# Patient Record
Sex: Female | Born: 1965 | Race: Black or African American | Hispanic: No | Marital: Married | Smoking: Former smoker
Health system: Southern US, Community
[De-identification: ages and names within clinical notes are randomized; demographics above are authoritative.]

## PROBLEM LIST (undated history)

## (undated) DIAGNOSIS — T783XXA Angioneurotic edema, initial encounter: Secondary | ICD-10-CM

## (undated) DIAGNOSIS — D649 Anemia, unspecified: Secondary | ICD-10-CM

## (undated) DIAGNOSIS — G629 Polyneuropathy, unspecified: Secondary | ICD-10-CM

## (undated) DIAGNOSIS — A6000 Herpesviral infection of urogenital system, unspecified: Secondary | ICD-10-CM

## (undated) DIAGNOSIS — N200 Calculus of kidney: Secondary | ICD-10-CM

## (undated) DIAGNOSIS — G894 Chronic pain syndrome: Secondary | ICD-10-CM

## (undated) DIAGNOSIS — A609 Anogenital herpesviral infection, unspecified: Secondary | ICD-10-CM

## (undated) DIAGNOSIS — G479 Sleep disorder, unspecified: Secondary | ICD-10-CM

## (undated) DIAGNOSIS — Z91013 Allergy to seafood: Secondary | ICD-10-CM

## (undated) DIAGNOSIS — F909 Attention-deficit hyperactivity disorder, unspecified type: Secondary | ICD-10-CM

## (undated) DIAGNOSIS — R0609 Other forms of dyspnea: Secondary | ICD-10-CM

## (undated) DIAGNOSIS — R0600 Dyspnea, unspecified: Secondary | ICD-10-CM

## (undated) DIAGNOSIS — K219 Gastro-esophageal reflux disease without esophagitis: Secondary | ICD-10-CM

## (undated) DIAGNOSIS — I10 Essential (primary) hypertension: Secondary | ICD-10-CM

## (undated) DIAGNOSIS — G4733 Obstructive sleep apnea (adult) (pediatric): Secondary | ICD-10-CM

## (undated) DIAGNOSIS — E7800 Pure hypercholesterolemia, unspecified: Secondary | ICD-10-CM

## (undated) DIAGNOSIS — I5030 Unspecified diastolic (congestive) heart failure: Secondary | ICD-10-CM

## (undated) DIAGNOSIS — M1990 Unspecified osteoarthritis, unspecified site: Secondary | ICD-10-CM

## (undated) DIAGNOSIS — E119 Type 2 diabetes mellitus without complications: Secondary | ICD-10-CM

## (undated) DIAGNOSIS — G4700 Insomnia, unspecified: Secondary | ICD-10-CM

## (undated) HISTORY — PX: TONSILLECTOMY: SUR1361

## (undated) HISTORY — DX: Unspecified diastolic (congestive) heart failure: I50.30

## (undated) HISTORY — DX: Polyneuropathy, unspecified: G62.9

## (undated) HISTORY — DX: Obstructive sleep apnea (adult) (pediatric): G47.33

## (undated) HISTORY — PX: TUBAL LIGATION: SHX77

## (undated) HISTORY — DX: Attention-deficit hyperactivity disorder, unspecified type: F90.9

## (undated) HISTORY — DX: Anogenital herpesviral infection, unspecified: A60.9

## (undated) HISTORY — DX: Insomnia, unspecified: G47.00

## (undated) HISTORY — PX: KIDNEY STONE SURGERY: SHX686

## (undated) HISTORY — DX: Pure hypercholesterolemia, unspecified: E78.00

## (undated) HISTORY — PX: ABDOMINAL HYSTERECTOMY: SHX81

## (undated) HISTORY — PX: ADENOIDECTOMY: SUR15

## (undated) HISTORY — DX: Morbid (severe) obesity due to excess calories: E66.01

## (undated) HISTORY — PX: ENDOMETRIAL ABLATION: SHX621

## (undated) HISTORY — DX: Sleep disorder, unspecified: G47.9

## (undated) HISTORY — DX: Herpesviral infection of urogenital system, unspecified: A60.00

## (undated) HISTORY — PX: ANAL FISSURECTOMY: SUR608

## (undated) HISTORY — PX: OTHER SURGICAL HISTORY: SHX169

## (undated) HISTORY — DX: Other forms of dyspnea: R06.09

## (undated) HISTORY — PX: CHOLECYSTECTOMY: SHX55

## (undated) HISTORY — DX: Allergy to seafood: Z91.013

## (undated) HISTORY — PX: TOTAL VAGINAL HYSTERECTOMY: SHX2548

## (undated) HISTORY — PX: HERNIA REPAIR: SHX51

## (undated) HISTORY — DX: Chronic pain syndrome: G89.4

## (undated) HISTORY — DX: Dyspnea, unspecified: R06.00

## (undated) HISTORY — DX: Angioneurotic edema, initial encounter: T78.3XXA

## (undated) HISTORY — PX: ENDOVENOUS ABLATION SAPHENOUS VEIN W/ LASER: SUR449

---

## 1990-09-19 HISTORY — PX: BREAST LUMPECTOMY: SHX2

## 2019-05-20 ENCOUNTER — Other Ambulatory Visit: Payer: Self-pay

## 2019-05-20 ENCOUNTER — Encounter (HOSPITAL_COMMUNITY): Payer: Self-pay | Admitting: Emergency Medicine

## 2019-05-20 ENCOUNTER — Emergency Department (HOSPITAL_COMMUNITY): Payer: 59

## 2019-05-20 ENCOUNTER — Emergency Department (HOSPITAL_COMMUNITY)
Admission: EM | Admit: 2019-05-20 | Discharge: 2019-05-20 | Disposition: A | Discharge status: Home / Self Care | Payer: 59 | Attending: Emergency Medicine | Admitting: Emergency Medicine

## 2019-05-20 DIAGNOSIS — R109 Unspecified abdominal pain: Secondary | ICD-10-CM

## 2019-05-20 DIAGNOSIS — I10 Essential (primary) hypertension: Secondary | ICD-10-CM | POA: Diagnosis not present

## 2019-05-20 DIAGNOSIS — Z87442 Personal history of urinary calculi: Secondary | ICD-10-CM | POA: Insufficient documentation

## 2019-05-20 DIAGNOSIS — E119 Type 2 diabetes mellitus without complications: Secondary | ICD-10-CM | POA: Insufficient documentation

## 2019-05-20 DIAGNOSIS — R319 Hematuria, unspecified: Secondary | ICD-10-CM | POA: Insufficient documentation

## 2019-05-20 HISTORY — DX: Essential (primary) hypertension: I10

## 2019-05-20 HISTORY — DX: Type 2 diabetes mellitus without complications: E11.9

## 2019-05-20 HISTORY — DX: Calculus of kidney: N20.0

## 2019-05-20 LAB — CBC
HCT: 35.8 % — ABNORMAL LOW (ref 36.0–46.0)
Hemoglobin: 11.3 g/dL — ABNORMAL LOW (ref 12.0–15.0)
MCH: 30.2 pg (ref 26.0–34.0)
MCHC: 31.6 g/dL (ref 30.0–36.0)
MCV: 95.7 fL (ref 80.0–100.0)
Platelets: 315 10*3/uL (ref 150–400)
RBC: 3.74 MIL/uL — ABNORMAL LOW (ref 3.87–5.11)
RDW: 12.9 % (ref 11.5–15.5)
WBC: 5.9 10*3/uL (ref 4.0–10.5)
nRBC: 0 % (ref 0.0–0.2)

## 2019-05-20 LAB — COMPREHENSIVE METABOLIC PANEL
ALT: 15 U/L (ref 0–44)
AST: 18 U/L (ref 15–41)
Albumin: 3.3 g/dL — ABNORMAL LOW (ref 3.5–5.0)
Alkaline Phosphatase: 54 U/L (ref 38–126)
Anion gap: 10 (ref 5–15)
BUN: 10 mg/dL (ref 6–20)
CO2: 26 mmol/L (ref 22–32)
Calcium: 8.7 mg/dL — ABNORMAL LOW (ref 8.9–10.3)
Chloride: 101 mmol/L (ref 98–111)
Creatinine, Ser: 0.76 mg/dL (ref 0.44–1.00)
GFR calc Af Amer: >60 mL/min (ref >60)
GFR calc non Af Amer: >60 mL/min (ref >60)
Glucose, Bld: 98 mg/dL (ref 70–99)
Potassium: 3.8 mmol/L (ref 3.5–5.1)
Sodium: 137 mmol/L (ref 135–145)
Total Bilirubin: 0.6 mg/dL (ref 0.3–1.2)
Total Protein: 6 g/dL — ABNORMAL LOW (ref 6.5–8.1)

## 2019-05-20 LAB — URINALYSIS, ROUTINE W REFLEX MICROSCOPIC
Bilirubin Urine: NEGATIVE
Glucose, UA: NEGATIVE mg/dL
Ketones, ur: NEGATIVE mg/dL
Nitrite: NEGATIVE
Protein, ur: 30 mg/dL — AB
Specific Gravity, Urine: 1.023 (ref 1.005–1.030)
pH: 5 (ref 5.0–8.0)

## 2019-05-20 LAB — LIPASE, BLOOD: Lipase: 29 U/L (ref 11–51)

## 2019-05-20 MED ORDER — LIDOCAINE 5 % EX PTCH: 1.0000 | MEDICATED_PATCH | CUTANEOUS | 0 refills | Status: DC

## 2019-05-20 MED ORDER — SODIUM CHLORIDE 0.9 % IV BOLUS
1000.0000 mL | Freq: Once | INTRAVENOUS | Status: AC
  Administered 2019-05-20: 1000 mL via INTRAVENOUS

## 2019-05-20 MED ORDER — HYDROMORPHONE HCL 1 MG/ML IJ SOLN
1.0000 mg | Freq: Once | INTRAMUSCULAR | Status: AC
  Administered 2019-05-20: 1 mg via INTRAVENOUS
  Filled 2019-05-20: qty 1

## 2019-05-20 MED ORDER — ONDANSETRON HCL 4 MG PO TABS: 4.0000 mg | ORAL_TABLET | Freq: Three times a day (TID) | ORAL | 0 refills | Status: DC | PRN

## 2019-05-20 MED ORDER — KETOROLAC TROMETHAMINE 30 MG/ML IJ SOLN
30.0000 mg | Freq: Once | INTRAMUSCULAR | Status: AC
  Administered 2019-05-20: 14:00:00 30 mg via INTRAVENOUS
  Filled 2019-05-20: qty 1

## 2019-05-20 NOTE — ED Notes (Signed)
Pt ambulatory to bathroom with no reported issues. 

## 2019-05-20 NOTE — ED Triage Notes (Signed)
Pt reports she has kidney stones and they have acting up since Wednesday. Pt reports hematuria and pain to the right flank.

## 2019-05-20 NOTE — ED Provider Notes (Signed)
[name] Provider Note   CSN: [id number] Arrival date & time: 05/20/19  1200     History   Chief Complaint Chief Complaint  Patient presents with   Nephrolithiasis    HPI Joanna Cuevas is a 53 y.o. female with history of type 2 diabetes mellitus, hypertension, nephrolithiasis presents for evaluation of acute onset, persistent and progressively worsening right flank pain for 5 days.  She reports symptoms are consistent with her usual kidney stone pain.  She reports that she has never been able to pass a kidney stone independently and has had 4 lithotripsies in the past in [place].  She is in the process of relocating from [place] to year but has not been able to see a urologist in the area yet.  The pain is intermittent, sharp, worsens with urination.  She notes some hematuria that developed a few days ago.  The pain acutely worsened yesterday.  She has had some nausea and vomiting but denies any at this time.  She denies diarrhea, constipation, chest pain, shortness of breath, or fevers.  She has been taking her home hydrocodone which she takes for degenerative arthritis. She is status post abdominal hysterectomy and cholecystectomy.    The history is provided by the patient.    Past Medical History:  Diagnosis Date   Diabetes mellitus without complication (HCC)    Type II   Hypertension    Kidney stones     There are no active problems to display for this patient.   Past Surgical History:  Procedure Laterality Date   ABDOMINAL HYSTERECTOMY     Anal cyst     Bladder Tuck     KIDNEY STONE SURGERY       OB History   No obstetric history on file.      Home Medications    Prior to Admission medications   Medication Sig Start Date End Date Taking? Authorizing Provider  lidocaine (LIDODERM) 5 % Place 1 patch onto the skin daily. Remove & Discard patch within 12 hours or as directed by MD 05/20/19   Michela PitcherFawze, Manisha Cancel A, PA-C    ondansetron (ZOFRAN) 4 MG tablet Take 1 tablet (4 mg total) by mouth every 8 (eight) hours as needed for nausea or vomiting. 05/20/19   Jeanie SewerFawze, Francesco Provencal A, PA-C    Family History No family history on file.  Social History Social History   Tobacco Use   Smoking status: Never Smoker   Smokeless tobacco: Never Used  Substance Use Topics   Alcohol use: Yes   Drug use: Never     Allergies   Shellfish allergy   Review of Systems Review of Systems  Constitutional: Negative for chills and fever.  Respiratory: Negative for shortness of breath.   Cardiovascular: Negative for chest pain.  Gastrointestinal: Positive for abdominal pain, nausea and vomiting. Negative for constipation and diarrhea.  Genitourinary: Positive for dysuria, flank pain and hematuria. Negative for frequency, urgency, vaginal bleeding, vaginal discharge and vaginal pain.  All other systems reviewed and are negative.    Physical Exam Updated Vital Signs BP 129/78    Pulse 60    Temp 98.1 F (36.7 C) (Oral)    Resp 18    Ht 5\' 6"  (1.676 m)    Wt (!) 150.6 kg    LMP  (Exact Date)    SpO2 97%    BMI 53.59 kg/m   Physical Exam Vitals signs and nursing note reviewed.  Constitutional:  General: She is not in acute distress.    Appearance: She is well-developed. She is obese.  HENT:     Head: Normocephalic and atraumatic.  Eyes:     General:        Right eye: No discharge.        Left eye: No discharge.     Conjunctiva/sclera: Conjunctivae normal.  Neck:     Vascular: No JVD.     Trachea: No tracheal deviation.  Cardiovascular:     Rate and Rhythm: Normal rate and regular rhythm.  Pulmonary:     Effort: Pulmonary effort is normal.     Breath sounds: Normal breath sounds.  Abdominal:     General: Bowel sounds are normal. There is no distension.     Palpations: Abdomen is soft.     Tenderness: There is abdominal tenderness in the right upper quadrant, right lower quadrant and suprapubic area. There  is right CVA tenderness. There is no guarding or rebound.  Skin:    General: Skin is warm and dry.     Findings: No erythema.  Neurological:     Mental Status: She is alert.  Psychiatric:        Behavior: Behavior normal.      ED Treatments / Results  Labs (all labs ordered are listed, but only abnormal results are displayed) Labs Reviewed  COMPREHENSIVE METABOLIC PANEL - Abnormal; Notable for the following components:      Result Value   Calcium 8.7 (*)    Total Protein 6.0 (*)    Albumin 3.3 (*)    All other components within normal limits  CBC - Abnormal; Notable for the following components:   RBC 3.74 (*)    Hemoglobin 11.3 (*)    HCT 35.8 (*)    All other components within normal limits  URINALYSIS, ROUTINE W REFLEX MICROSCOPIC - Abnormal; Notable for the following components:   APPearance HAZY (*)    Hgb urine dipstick LARGE (*)    Protein, ur 30 (*)    Leukocytes,Ua TRACE (*)    Bacteria, UA RARE (*)    All other components within normal limits  LIPASE, BLOOD    EKG None  Radiology Ct Renal Stone Study  Result Date: 05/20/2019 CLINICAL DATA:  Right sided pain EXAM: CT ABDOMEN AND PELVIS WITHOUT CONTRAST TECHNIQUE: Multidetector CT imaging of the abdomen and pelvis was performed following the standard protocol without IV contrast. COMPARISON:  None. FINDINGS: Lower chest: Small hiatal hernia. Evaluation of the abdominal organs is somewhat limited by the lack of IV contrast. Hepatobiliary: No focal liver abnormality is seen. Status post cholecystectomy. No biliary dilatation. Pancreas: Unremarkable. No pancreatic ductal dilatation or surrounding inflammatory changes. Spleen: Normal in size without focal abnormality. Adrenals/Urinary Tract: Adrenal glands are unremarkable. The kidneys are symmetric in size. There are a few left renal calculi, the largest measuring 5 mm. No right renal calculi identified. No hydronephrosis. The bladder is unremarkable. A few calcified  densities in the pelvis likely represent vascular phleboliths. Stomach/Bowel: Stomach is within normal limits. Appendix appears normal. No evidence of bowel wall thickening, distention, or inflammatory changes. Mobile cecum is located in the left lower quadrant. Vascular/Lymphatic: No abdominal aneurysm. No adenopathy. Reproductive: Status post hysterectomy. No adnexal masses. Other: No abdominal wall hernia or abnormality. No abdominopelvic ascites. Musculoskeletal: No acute or significant osseous findings. IMPRESSION: 1. No CT finding to explain the patient's right-sided pain. 2. Nonobstructive left nephrolithiasis. 3. Incidentally noted mobile cecum located along with the  appendix in the left abdomen. Electronically Signed   By: Emmaline Kluver M.D.   On: 05/20/2019 16:43    Procedures Procedures (including critical care time)  Medications Ordered in ED Medications  sodium chloride 0.9 % bolus 1,000 mL (0 mLs Intravenous Stopped 05/20/19 1702)  ketorolac (TORADOL) 30 MG/ML injection 30 mg (30 mg Intravenous Given 05/20/19 1400)  HYDROmorphone (DILAUDID) injection 1 mg (1 mg Intravenous Given 05/20/19 1401)  HYDROmorphone (DILAUDID) injection 1 mg (1 mg Intravenous Given 05/20/19 1731)     Initial Impression / Assessment and Plan / ED Course  I have reviewed the triage vital signs and the nursing notes.  Pertinent labs & imaging results that were available during my care of the patient were reviewed by me and considered in my medical decision making (see chart for details).        Patient presenting for evaluation of right flank pain with associated hematuria.  She is a history of nephrolithiasis states that she is never been able to pass a kidney stone independently and has always required lithotripsy.  She is relocating from [place] to [place] and has not established care with a PCP or urologist here.  In the ED she is afebrile, vital signs are stable.  She is nontoxic in appearance.  No  peritoneal signs on examination of the abdomen.  Lab work reviewed by me shows no leukocytosis, mild anemia, no metabolic derangements, no renal insufficiency.  Her UA is suggestive of nephrolithiasis but not UTI with no significant pyuria, WBC or large bacteria.  CT scan shows no findings to explain her right-sided pain.  She has nonobstructive left nephrolithiasis.  No evidence of hydronephrosis or pyelonephritis.  No evidence of acute surgical abdominal pathology.  On reevaluation patient resting comfortably with improvement in her pain.  Also consider possible musculoskeletal pain.  She is tolerating p.o. fluids without difficulty.  She has hydrocodone and Mobic at home prescribed by her PCP in [place].  Recommend follow-up with PCP and urology for reevaluation.  Will consult case management for assistance with obtaining PCP follow-up.  Discussed strict ED return precautions. Patient verbalized understanding of and agreement with plan and is safe for discharge home at this time.  Final Clinical Impressions(s) / ED Diagnoses   Final diagnoses:  Acute right flank pain  Hematuria, unspecified type    ED Discharge Orders         Ordered    ondansetron (ZOFRAN) 4 MG tablet  Every 8 hours PRN     05/20/19 1739    lidocaine (LIDODERM) 5 %  Every 24 hours     05/20/19 1740           Jodeci Roarty, [place] A, PA-C 05/20/19 1749    Melene Plan, DO 05/21/19 1506

## 2019-05-20 NOTE — Care Management (Signed)
ED CM spoke with the patient at the bedside. Patient reports she has been going to [place] for her appointments which is over 8 hours driving. She plans to enroll in [facility] health plan during the open enrollment period. She currently has [employer] and Medicaid in [place]. Patient reports has been able to get her prescriptions filled at [facility] to date. Reports independence with ADLs. Provided with the contact information for [place]. Patient agrees to call tomorrow to schedule an appointment. ED CM will notify [facility] of the referral for a PCP.

## 2019-05-20 NOTE — Discharge Instructions (Addendum)
Continue taking your home medications as prescribed including your Mobic and hydrocodone.  Drink plenty fluids and get plenty of rest.  Apply lidocaine patches to areas of pain as prescribed.  Take Zofran as needed for nausea and vomiting.  Call [place] and wellness tomorrow and let them know you were referred from the emergency department to establish primary care services.  They also have financial assistance.  I have also given you the information for the urology group in the area whom you should follow-up with for your kidney stones.  Return to the emergency department if any concerning signs or symptoms develop such as high fevers, persistent vomiting, or worsening pain.

## 2019-05-20 NOTE — ED Notes (Signed)
Pt verbalized understanding of discharge paperwork, prescriptions and follow-up care 

## 2019-05-23 ENCOUNTER — Telehealth: Payer: Self-pay

## 2019-05-23 NOTE — Telephone Encounter (Signed)
Message received from Venita Sheffield, RN CM requesting a hospital follow up appointment for the patient.  Call placed to patient and scheduled her for appointment at [facility] 06/05/2019 @ 1410. Provided her with the address of the clinic.

## 2019-06-05 ENCOUNTER — Ambulatory Visit (INDEPENDENT_AMBULATORY_CARE_PROVIDER_SITE_OTHER): Payer: 59 | Admitting: Nurse Practitioner

## 2019-06-05 DIAGNOSIS — I10 Essential (primary) hypertension: Secondary | ICD-10-CM | POA: Diagnosis not present

## 2019-06-05 DIAGNOSIS — D649 Anemia, unspecified: Secondary | ICD-10-CM | POA: Diagnosis not present

## 2019-06-05 DIAGNOSIS — E1142 Type 2 diabetes mellitus with diabetic polyneuropathy: Secondary | ICD-10-CM

## 2019-06-05 DIAGNOSIS — Z7689 Persons encountering health services in other specified circumstances: Secondary | ICD-10-CM

## 2019-06-05 DIAGNOSIS — E1165 Type 2 diabetes mellitus with hyperglycemia: Secondary | ICD-10-CM | POA: Diagnosis not present

## 2019-06-05 DIAGNOSIS — G894 Chronic pain syndrome: Secondary | ICD-10-CM

## 2019-06-05 DIAGNOSIS — IMO0002 Reserved for concepts with insufficient information to code with codable children: Secondary | ICD-10-CM

## 2019-06-05 NOTE — Progress Notes (Signed)
Virtual Visit via Telephone Note Due to national recommendations of social distancing due to COVID 19, telehealth visit is felt to be most appropriate for this patient at this time.  I discussed the limitations, risks, security and privacy concerns of performing an evaluation and management service by telephone and the availability of in person appointments. I also discussed with the patient that there may be a patient responsible charge related to this service. The patient expressed understanding and agreed to proceed.    I connected with Danae Chen on 06/05/19  at   2:10 PM EDT  EDT by telephone and verified that I am speaking with the correct person using two identifiers.   Consent I discussed the limitations, risks, security and privacy concerns of performing an evaluation and management service by telephone and the availability of in person appointments. I also discussed with the patient that there may be a patient responsible charge related to this service. The patient expressed understanding and agreed to proceed.   Location of Patient: Private Residence    Location of Provider: [facility]    Persons participating in Telemedicine visit: Bertram Denver FNP-BC [facility] [place] CMA Pattricia Boss Mayers    History of Present Illness: Telemedicine visit for: Establish Care  has a past medical history of Chronic pain syndrome, Diabetes mellitus without complication (HCC), Genital herpes simplex, Hypertension, Kidney stones, Morbid obesity (HCC), and OSA on CPAP.   She moved here from [place] in March. Reports colonoscopy last year with polyps removed. Mammogram one year ago was normal. S/P hysterectomy so no PAP smears due.  She has CPS and reports pain mostly in back, shoulders, and right ankle. She had an accident in the past and fell in a hole, fractured her ankle and never had surgery. There are several medications on patient's list that I have explained to her I do not  feel comfortable prescribing and she will need to be referred to specialists (Adderall 30 mg, Norco 7.5-325 mg 4 times a day, Premarin 0.625 mg daily, and Ambien 10 mg at bedtime). She verbalized understanding. She will need to have her insurance switched from [place] to [place] prior to the referrals being made. She states she is in the process of having that done.   She states she is traveling back to [place] soon and her PCP assured her that he will give her 2 months supply of all of her medications until she is established here in [place].  She takes Ambien for sleep due to her OSA on CPAP.   DM TYPE 2 Uncontrolled per patient report with average fasting and postprandial readings 98-110s. Monitors her blood glucose levels at home twice a day.  She denies any hypo glycemic symptoms but does endorse diabetic neuropathy.  She cannot recall her last reported A1c.  Taking metformin 1 mg twice daily.   Essential Hypertension Chronic and well controlled.  She does not monitor her blood pressure often and reports she only checks her BP when she thinks her blood glucose levels are high.  She is not diet or exercise compliant. BP Readings from Last 3 Encounters:  05/20/19 125/69    Past Medical History:  Diagnosis Date  . Chronic pain syndrome   . Diabetes mellitus without complication (HCC)    Type II  . Genital herpes simplex   . Hypertension   . Kidney stones   . Morbid obesity (HCC)   . OSA on CPAP     Past Surgical History:  Procedure Laterality Date  . ABDOMINAL HYSTERECTOMY    . ANAL FISSURECTOMY    . Bladder Tuck    . CESAREAN SECTION    . CHOLECYSTECTOMY    . ENDOMETRIAL ABLATION    . ENDOVENOUS ABLATION SAPHENOUS VEIN W/ LASER    . HERNIA REPAIR    . KIDNEY STONE SURGERY    . TONSILLECTOMY    . TOTAL VAGINAL HYSTERECTOMY    . TUBAL LIGATION      History reviewed. No pertinent family history.  Social History   Socioeconomic History  . Marital status: Married     Spouse name: Not on file  . Number of children: Not on file  . Years of education: Not on file  . Highest education level: Not on file  Occupational History  . Not on file  Social Needs  . Financial resource strain: Not on file  . Food insecurity    Worry: Not on file    Inability: Not on file  . Transportation needs    Medical: Not on file    Non-medical: Not on file  Tobacco Use  . Smoking status: Never Smoker  . Smokeless tobacco: Never Used  Substance and Sexual Activity  . Alcohol use: Yes  . Drug use: Never  . Sexual activity: Yes  Lifestyle  . Physical activity    Days per week: Not on file    Minutes per session: Not on file  . Stress: Not on file  Relationships  . Social Herbalist on phone: Not on file    Gets together: Not on file    Attends religious service: Not on file    Active member of club or organization: Not on file    Attends meetings of clubs or organizations: Not on file    Relationship status: Not on file  Other Topics Concern  . Not on file  Social History Narrative  . Not on file     Observations/Objective: Awake, alert and oriented x 3   Review of Systems  Constitutional: Negative for fever, malaise/fatigue and weight loss.  HENT: Negative.  Negative for nosebleeds.   Eyes: Negative.  Negative for blurred vision, double vision and photophobia.  Respiratory: Negative.  Negative for cough and shortness of breath.   Cardiovascular: Negative.  Negative for chest pain, palpitations and leg swelling.  Gastrointestinal: Positive for constipation (chronic). Negative for heartburn, nausea and vomiting.  Musculoskeletal: Positive for back pain, joint pain and myalgias.  Neurological: Positive for sensory change. Negative for dizziness, focal weakness, seizures and headaches.  Psychiatric/Behavioral: Negative.  Negative for suicidal ideas.    Assessment and Plan:  Diagnoses and all orders for this visit:  Encounter to establish  care  Essential hypertension Continue all antihypertensives as prescribed.  Remember to bring in your blood pressure log with you for your follow up appointment.  DASH/Mediterranean Diets are healthier choices for HTN.   Diabetes mellitus type 2, uncontrolled, with complications ([place]) Continue blood sugar control as discussed in office today, low carbohydrate diet, and regular physical exercise as tolerated, 150 minutes per week (30 min each day, 5 days per week, or 50 min 3 days per week). Keep blood sugar logs with fasting goal of 90-130 mg/dl, post prandial (after you eat) less than 180.  For Hypoglycemia: BS <60 and Hyperglycemia BS >400; contact the clinic ASAP. Annual eye exams and foot exams are recommended.  Diabetic peripheral neuropathy ([place]) Continue blood sugar control as discussed in office today  Anemia, unspecified type Will need referral to GI for anemia  Chronic pain syndrome Will need referral to pain management.     Follow Up Instructions Return for Fasting labs.     I discussed the assessment and treatment plan with the patient. The patient was provided an opportunity to ask questions and all were answered. The patient agreed with the plan and demonstrated an understanding of the instructions.   The patient was advised to call back or seek an in-person evaluation if the symptoms worsen or if the condition fails to improve as anticipated.  I provided 19 minutes of non-face-to-face time during this encounter including median intraservice time, reviewing previous notes, labs, imaging, medications and explaining diagnosis and management.  Claiborne RiggZelda W Fleming, FNP-BC

## 2019-08-23 ENCOUNTER — Ambulatory Visit
Admission: RE | Admit: 2019-08-23 | Discharge: 2019-08-23 | Disposition: A | Discharge status: Home / Self Care | Payer: 59 | Source: Ambulatory Visit | Attending: Pain Medicine | Admitting: Pain Medicine

## 2019-08-23 ENCOUNTER — Other Ambulatory Visit: Payer: Self-pay | Admitting: Pain Medicine

## 2019-08-23 ENCOUNTER — Other Ambulatory Visit: Payer: Self-pay

## 2019-08-23 DIAGNOSIS — M25572 Pain in left ankle and joints of left foot: Secondary | ICD-10-CM

## 2019-08-23 DIAGNOSIS — R52 Pain, unspecified: Secondary | ICD-10-CM

## 2019-08-23 DIAGNOSIS — M25571 Pain in right ankle and joints of right foot: Secondary | ICD-10-CM

## 2019-08-29 ENCOUNTER — Other Ambulatory Visit: Payer: Self-pay | Admitting: Pain Medicine

## 2019-08-29 DIAGNOSIS — G8929 Other chronic pain: Secondary | ICD-10-CM

## 2019-08-29 DIAGNOSIS — M545 Low back pain, unspecified: Secondary | ICD-10-CM

## 2019-09-14 ENCOUNTER — Other Ambulatory Visit: Payer: Medicare Other

## 2019-09-19 ENCOUNTER — Other Ambulatory Visit: Payer: Self-pay | Admitting: Obstetrics and Gynecology

## 2019-09-19 DIAGNOSIS — Z1231 Encounter for screening mammogram for malignant neoplasm of breast: Secondary | ICD-10-CM

## 2019-09-21 ENCOUNTER — Other Ambulatory Visit: Payer: Self-pay

## 2019-09-21 ENCOUNTER — Ambulatory Visit
Admission: RE | Admit: 2019-09-21 | Discharge: 2019-09-21 | Disposition: A | Discharge status: Home / Self Care | Payer: Medicare Other | Source: Ambulatory Visit | Attending: Pain Medicine | Admitting: Pain Medicine

## 2019-09-21 DIAGNOSIS — G8929 Other chronic pain: Secondary | ICD-10-CM

## 2019-09-21 DIAGNOSIS — M545 Low back pain, unspecified: Secondary | ICD-10-CM

## 2019-09-24 ENCOUNTER — Other Ambulatory Visit: Payer: Self-pay | Admitting: Family Medicine

## 2019-09-24 ENCOUNTER — Ambulatory Visit
Admission: RE | Admit: 2019-09-24 | Discharge: 2019-09-24 | Disposition: A | Discharge status: Home / Self Care | Payer: Medicare Other | Source: Ambulatory Visit | Attending: Family Medicine | Admitting: Family Medicine

## 2019-09-24 ENCOUNTER — Other Ambulatory Visit: Payer: Self-pay

## 2019-09-24 DIAGNOSIS — M79671 Pain in right foot: Secondary | ICD-10-CM

## 2019-10-29 ENCOUNTER — Other Ambulatory Visit (HOSPITAL_COMMUNITY): Payer: Self-pay | Admitting: Family Medicine

## 2019-10-29 ENCOUNTER — Ambulatory Visit
Admission: RE | Admit: 2019-10-29 | Discharge: 2019-10-29 | Disposition: A | Discharge status: Home / Self Care | Payer: Medicaid Other | Source: Ambulatory Visit | Attending: Obstetrics and Gynecology | Admitting: Obstetrics and Gynecology

## 2019-10-29 ENCOUNTER — Other Ambulatory Visit: Payer: Self-pay

## 2019-10-29 DIAGNOSIS — I509 Heart failure, unspecified: Secondary | ICD-10-CM

## 2019-10-29 DIAGNOSIS — Z1231 Encounter for screening mammogram for malignant neoplasm of breast: Secondary | ICD-10-CM

## 2019-10-31 ENCOUNTER — Other Ambulatory Visit: Payer: Self-pay | Admitting: Pain Medicine

## 2019-10-31 DIAGNOSIS — M25511 Pain in right shoulder: Secondary | ICD-10-CM

## 2019-11-04 ENCOUNTER — Ambulatory Visit (HOSPITAL_COMMUNITY): Payer: Medicare Other | Attending: Cardiology

## 2019-11-04 ENCOUNTER — Other Ambulatory Visit: Payer: Self-pay

## 2019-11-04 DIAGNOSIS — I10 Essential (primary) hypertension: Secondary | ICD-10-CM

## 2019-11-04 DIAGNOSIS — I509 Heart failure, unspecified: Secondary | ICD-10-CM | POA: Insufficient documentation

## 2019-12-03 ENCOUNTER — Ambulatory Visit
Admission: RE | Admit: 2019-12-03 | Discharge: 2019-12-03 | Disposition: A | Discharge status: Home / Self Care | Payer: Medicaid Other | Source: Ambulatory Visit | Attending: Pain Medicine | Admitting: Pain Medicine

## 2019-12-03 ENCOUNTER — Other Ambulatory Visit: Payer: Self-pay

## 2019-12-03 DIAGNOSIS — M25511 Pain in right shoulder: Secondary | ICD-10-CM

## 2020-03-05 ENCOUNTER — Ambulatory Visit: Payer: Medicare Other | Attending: Family Medicine

## 2020-03-05 ENCOUNTER — Other Ambulatory Visit: Payer: Self-pay

## 2020-03-05 DIAGNOSIS — M25511 Pain in right shoulder: Secondary | ICD-10-CM | POA: Insufficient documentation

## 2020-03-05 DIAGNOSIS — M6281 Muscle weakness (generalized): Secondary | ICD-10-CM | POA: Diagnosis present

## 2020-03-05 DIAGNOSIS — G8929 Other chronic pain: Secondary | ICD-10-CM | POA: Insufficient documentation

## 2020-03-05 DIAGNOSIS — M25611 Stiffness of right shoulder, not elsewhere classified: Secondary | ICD-10-CM | POA: Diagnosis present
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
## 2020-03-05 NOTE — Patient Instructions (Signed)
Access Code: [email or web address] URL: [email or web address] Date: 03/05/2020 Prepared by: Tresa Endo  Exercises Supine Shoulder Press - 2 x daily - 7 x weekly - 2 sets - 5 reps Supine Shoulder Flexion AAROM with Hands Clasped - 3 x daily - 7 x weekly - 10 reps - 1 sets - 10 hold Seated Shoulder Flexion Towel Slide at Table Top - 3 x daily - 7 x weekly - 10 reps - 1 sets - 10 hold Seated Scapular Retraction - 3 x daily - 7 x weekly - 10 reps - 5 hold

## 2020-03-31 ENCOUNTER — Ambulatory Visit: Payer: Medicare Other | Attending: Family Medicine

## 2020-03-31 ENCOUNTER — Other Ambulatory Visit: Payer: Self-pay

## 2020-03-31 DIAGNOSIS — G8929 Other chronic pain: Secondary | ICD-10-CM | POA: Insufficient documentation

## 2020-03-31 DIAGNOSIS — M25611 Stiffness of right shoulder, not elsewhere classified: Secondary | ICD-10-CM | POA: Diagnosis present

## 2020-03-31 DIAGNOSIS — M25511 Pain in right shoulder: Secondary | ICD-10-CM | POA: Insufficient documentation

## 2020-03-31 DIAGNOSIS — M6281 Muscle weakness (generalized): Secondary | ICD-10-CM | POA: Diagnosis present

## 2020-03-31 NOTE — Patient Instructions (Signed)
Access Code: [email or web address] URL: [email or web address] Date: 03/31/2020 Prepared by: Tresa Endo  Exercises   Isometric Shoulder Flexion at Wall - 2 x daily - 5 x weekly - 1 sets - 10 reps - 5 hold Isometric Shoulder Abduction at Wall - 2 x daily - 5 x weekly - 1 sets - 10 reps - 5 hold Isometric Shoulder External Rotation at Wall - 2 x daily - 5 x weekly - 1 sets - 10 reps - 5 hold Standing Isometric Shoulder Internal Rotation at Doorway - 2 x daily - 5 x weekly - 2 reps - 1 sets - 2 hold Shoulder Flexion Wall Slide with Towel - 1 x daily - 7 x weekly - 3 sets - 10 reps

## 2020-03-31 NOTE — Therapy (Signed)
[facility] [address] [place], [place], [address] Phone: [phone]   Fax:  [phone]  Physical Therapy Treatment  Patient Details  Name: Shatasha Lambing MRN: [id number] Date of Birth: 04-15-1966 Referring Provider (PT): Patrice Paradise, MD   Encounter Date: 03/31/2020   PT End of Session - 03/31/20 1224    Visit Number 2    Date for PT Re-Evaluation 04/30/20    Authorization Type UHC Medicare/Medicaid    PT Start Time 1149    PT Stop Time 1220   fatigue- ended early   PT Time Calculation (min) 31 min    Activity Tolerance Patient tolerated treatment well;Patient limited by fatigue    Behavior During Therapy [facility] for tasks assessed/performed           Past Medical History:  Diagnosis Date  . Chronic pain syndrome   . Diabetes mellitus without complication (HCC)    Type II  . Genital herpes simplex   . Hypertension   . Kidney stones   . Morbid obesity (HCC)   . OSA on CPAP     Past Surgical History:  Procedure Laterality Date  . ABDOMINAL HYSTERECTOMY    . ANAL FISSURECTOMY    . Bladder Tuck    . BREAST LUMPECTOMY  1992   benign - removed fatty tissue under both arms   . CESAREAN SECTION    . CHOLECYSTECTOMY    . ENDOMETRIAL ABLATION    . ENDOVENOUS ABLATION SAPHENOUS VEIN W/ LASER    . HERNIA REPAIR    . KIDNEY STONE SURGERY    . TONSILLECTOMY    . TOTAL VAGINAL HYSTERECTOMY    . TUBAL LIGATION      There were no vitals filed for this visit.   Subjective Assessment - 03/31/20 1149    Currently in Pain? Yes    Pain Score 5     Pain Location Shoulder    Pain Orientation Right    Pain Descriptors / Indicators Throbbing    Pain Type Chronic pain    Pain Onset More than a month ago    Pain Frequency Constant    Aggravating Factors  lifting and carrying with Rt UE, sleep at night    Pain Relieving Factors not performing aggravating activity, rubbing              [facility] PT  Assessment - 03/31/20 0001      Assessment   Medical Diagnosis Rt torn rotator cuff, limited active and passive ROM      AROM   Right Shoulder Flexion 110 Degrees    Right Shoulder ABduction 80 Degrees    Right Shoulder External Rotation --   T2 without assistance from other arm.                        [facility] Adult PT Treatment/Exercise - 03/31/20 0001      Exercises   Exercises Shoulder      Shoulder Exercises: Seated   Other Seated Exercises UE ranger into flexion x 10, circles x 5 each direction      Shoulder Exercises: Standing   Flexion AAROM;Right;10 reps    Flexion Limitations using finger ladder      Shoulder Exercises: Pulleys   Flexion 2 minutes   fatigue     Shoulder Exercises: Isometric Strengthening   Flexion 5X5"    Extension 5X5"    External Rotation 5X5"    Internal Rotation 5X5"  PT Education - 03/31/20 1210    Education Details Access Code: [email or web address]    Person(s) Educated Patient    Methods Explanation;Demonstration;Handout    Comprehension Verbalized understanding;Returned demonstration            PT Short Term Goals - 03/31/20 1225      PT SHORT TERM GOAL #1   Title be indpendent in initial HEP    Status Achieved      PT SHORT TERM GOAL #2   Title demonstrate Rt shoulder flexion A/ROM to > or = to 100 degrees to improve overhead reaching    Status Achieved      PT SHORT TERM GOAL #3   Title report a 25% reduction in Rt shoulder pain to improve sleep and use of the Rt UE with self-care    Status Achieved             PT Long Term Goals - 03/05/20 1230      PT LONG TERM GOAL #1   Title be independent in advanced HEP    Time 8    Period Weeks    Status New    Target Date 04/30/20      PT LONG TERM GOAL #2   Title reduce FOTO to < or = to 32% limitation    Time 8    Period Weeks    Status New    Target Date 04/30/20      PT LONG TERM GOAL #3   Title report a 60% reduction in Rt shoulder  pain with sleep and use    Time 8    Period Weeks    Status New    Target Date 04/30/20      PT LONG TERM GOAL #4   Title demonstrate Rt shoulder A/ROM flexion to > or 125 degrees to improve reaching overhead    Time 8    Period Weeks    Status New    Target Date 04/30/20      PT LONG TERM GOAL #5   Title demonstrate > or = to 4/5 Rt shoulder flexion to improve functional use    Time 8    Period Weeks    Status New    Target Date 04/30/20                 Plan - 03/31/20 1200    Clinical Impression Statement Pt with lapse in treatment since the evaluation.  Pt is independent and compliant in HEP for Rt shoulder A/AROM.  Pt reports increased ease with use of Rt UE and reduced pain overall.  Pt is now able to sleep on the Rt UE and is able to reach higher with functional use.  Rt UE A/ROM is improved today in all directions with less scapular substitution.  Pt requires tactile cues for technique and to reduce scapular elevation with exercise today.  PT issued isometrics to improve functional strength of Rt UE.  Pt will continue to benefit from skilled PT to improve Rt UE strength, ROM and functional use.    PT Frequency 2x / week    PT Duration 8 weeks    PT Treatment/Interventions ADLs/Self Care Home Management;Cryotherapy;Electrical Stimulation;Moist Heat;Therapeutic activities;Therapeutic exercise;Neuromuscular re-education;Manual techniques;Patient/family education;Passive range of motion;Dry needling;Taping    PT Next Visit Plan Rt shoulder strength, ROM,  review isometrics    PT Home Exercise Plan Access Code: [email or web address]    Recommended Other Services initial cert is signed  Patient will benefit from skilled therapeutic intervention in order to improve the following deficits and impairments:  Decreased activity tolerance, Decreased strength, Postural dysfunction, Improper body mechanics, Impaired flexibility, Hypomobility, Pain, Increased muscle spasms, Decreased  endurance, Decreased range of motion, Difficulty walking  Visit Diagnosis: Chronic right shoulder pain  Stiffness of right shoulder, not elsewhere classified  Muscle weakness (generalized)     Problem List There are no problems to display for this patient.   Lorrene Reid, PT 03/31/20 12:26 PM  [place] [address] [place], [place], [address] Phone: [phone]   Fax:  [phone]  Name: Kinzlee Selvy MRN: [id number] Date of Birth: [birth year]

## 2020-04-01 ENCOUNTER — Other Ambulatory Visit: Payer: Self-pay | Admitting: Pain Medicine

## 2020-04-01 ENCOUNTER — Ambulatory Visit
Admission: RE | Admit: 2020-04-01 | Discharge: 2020-04-01 | Disposition: A | Discharge status: Home / Self Care | Payer: Medicare Other | Source: Ambulatory Visit | Attending: Pain Medicine | Admitting: Pain Medicine

## 2020-04-01 DIAGNOSIS — M25551 Pain in right hip: Secondary | ICD-10-CM

## 2020-04-06 ENCOUNTER — Other Ambulatory Visit: Payer: Self-pay

## 2020-04-06 ENCOUNTER — Encounter: Payer: Self-pay | Admitting: Physical Therapy

## 2020-04-06 ENCOUNTER — Ambulatory Visit: Payer: Medicare Other | Admitting: Physical Therapy

## 2020-04-06 DIAGNOSIS — M6281 Muscle weakness (generalized): Secondary | ICD-10-CM

## 2020-04-06 DIAGNOSIS — G8929 Other chronic pain: Secondary | ICD-10-CM

## 2020-04-06 DIAGNOSIS — M25611 Stiffness of right shoulder, not elsewhere classified: Secondary | ICD-10-CM

## 2020-04-06 DIAGNOSIS — M25511 Pain in right shoulder: Secondary | ICD-10-CM | POA: Diagnosis not present

## 2020-04-06 NOTE — Therapy (Signed)
[facility] [address], [address] [place], [place], [address] Phone: [phone]   Fax:  [phone]  Physical Therapy Treatment  Patient Details  Name: Joanna Cuevas MRN: [id number] Date of Birth: 04-May-1966 Referring Provider (PT): Patrice Paradise, MD   Encounter Date: 04/06/2020   PT End of Session - 04/06/20 1148    Visit Number 3    Authorization Type UHC Medicare/Medicaid    PT Start Time 1146    PT Stop Time 1226    PT Time Calculation (min) 40 min    Activity Tolerance --    Behavior During Therapy [facility] for tasks assessed/performed           Past Medical History:  Diagnosis Date  . Chronic pain syndrome   . Diabetes mellitus without complication (HCC)    Type II  . Genital herpes simplex   . Hypertension   . Kidney stones   . Morbid obesity (HCC)   . OSA on CPAP     Past Surgical History:  Procedure Laterality Date  . ABDOMINAL HYSTERECTOMY    . ANAL FISSURECTOMY    . Bladder Tuck    . BREAST LUMPECTOMY  1992   benign - removed fatty tissue under both arms   . CESAREAN SECTION    . CHOLECYSTECTOMY    . ENDOMETRIAL ABLATION    . ENDOVENOUS ABLATION SAPHENOUS VEIN W/ LASER    . HERNIA REPAIR    . KIDNEY STONE SURGERY    . TONSILLECTOMY    . TOTAL VAGINAL HYSTERECTOMY    . TUBAL LIGATION      There were no vitals filed for this visit.   Subjective Assessment - 04/06/20 1152    Subjective I have a toothache this AM. Shoulder is "medium pain." Did ok after my last time here.    Pertinent History chronic LBP, Rt LE pain    Currently in Pain? Yes    Pain Score 5     Pain Location Shoulder    Pain Orientation Right    Pain Descriptors / Indicators Sore    Aggravating Factors  Using it too much    Pain Relieving Factors Probbaly exercises,                             [facility] Adult PT Treatment/Exercise - 04/06/20 0001      Shoulder Exercises: Seated   Flexion  Strengthening;Right;5 reps;Weights   Scaption 1# 5x   Flexion Weight (lbs) 1    Other Seated Exercises hands clasped flexion over head 10x    Other Seated Exercises green loop band 5x ER       Shoulder Exercises: Standing   Extension Strengthening;Both;10 reps;Theraband    Theraband Level (Shoulder Extension) Level 1 (Yellow)    Row Strengthening;Both;10 reps;Theraband    Theraband Level (Shoulder Row) Level 2 (Red)      Shoulder Exercises: Pulleys   Flexion 3 minutes      Shoulder Exercises: ROM/Strengthening   UBE (Upper Arm Bike) L1 2x2 to en dsession with PTA present to monitor pain.       Shoulder Exercises: Isometric Strengthening   Flexion 5X10"    Extension 5X10"    External Rotation 5X10"    Internal Rotation 5X10"                    PT Short Term Goals - 03/31/20 1225      PT  SHORT TERM GOAL #1   Title be indpendent in initial HEP    Status Achieved      PT SHORT TERM GOAL #2   Title demonstrate Rt shoulder flexion A/ROM to > or = to 100 degrees to improve overhead reaching    Status Achieved      PT SHORT TERM GOAL #3   Title report a 25% reduction in Rt shoulder pain to improve sleep and use of the Rt UE with self-care    Status Achieved             PT Long Term Goals - 03/05/20 1230      PT LONG TERM GOAL #1   Title be independent in advanced HEP    Time 8    Period Weeks    Status New    Target Date 04/30/20      PT LONG TERM GOAL #2   Title reduce FOTO to < or = to 32% limitation    Time 8    Period Weeks    Status New    Target Date 04/30/20      PT LONG TERM GOAL #3   Title report a 60% reduction in Rt shoulder pain with sleep and use    Time 8    Period Weeks    Status New    Target Date 04/30/20      PT LONG TERM GOAL #4   Title demonstrate Rt shoulder A/ROM flexion to > or 125 degrees to improve reaching overhead    Time 8    Period Weeks    Status New    Target Date 04/30/20      PT LONG TERM GOAL #5   Title  demonstrate > or = to 4/5 Rt shoulder flexion to improve functional use    Time 8    Period Weeks    Status New    Target Date 04/30/20                 Plan - 04/06/20 1200    Clinical Impression Statement Pt arrives with "medium" pain per report. She reports no adverse reactions since last session and appears compliant with her HEP. Some minor verbal cuing to keep upper taps relaxed but overall improving in that. Pt was able to use light theraband and 1# weights today without exacerbation of pain.    Personal Factors and Comorbidities Comorbidity 2    Comorbidities chronic LBP and Rt LE pain    Examination-Activity Limitations Carry;Lift;Reach Overhead;Sleep    Examination-Participation Restrictions Driving;Shop;Meal Prep    Stability/Clinical Decision Making Stable/Uncomplicated    Rehab Potential Good    PT Frequency 2x / week    PT Duration 8 weeks    PT Treatment/Interventions ADLs/Self Care Home Management;Cryotherapy;Electrical Stimulation;Moist Heat;Therapeutic activities;Therapeutic exercise;Neuromuscular re-education;Manual techniques;Patient/family education;Passive range of motion;Dry needling;Taping    PT Next Visit Plan Rt shoulder strength, ROM, give bands for HEP next session if pt did well today.    PT Home Exercise Plan Access Code: [email or web address]    Consulted and Agree with Plan of Care Patient           Patient will benefit from skilled therapeutic intervention in order to improve the following deficits and impairments:  Decreased activity tolerance, Decreased strength, Postural dysfunction, Improper body mechanics, Impaired flexibility, Hypomobility, Pain, Increased muscle spasms, Decreased endurance, Decreased range of motion, Difficulty walking  Visit Diagnosis: Chronic right shoulder pain  Stiffness of right shoulder, not elsewhere classified  Muscle  weakness (generalized)     Problem List There are no problems to display for this  patient.   Navon Kotowski, PTA 04/06/2020, 12:26 PM  [facility] [address], [address] [place], [place], [address] Phone: [phone]   Fax:  [phone]  Name: Marlean Mortell MRN: [id number] Date of Birth: Aug 18, 1966

## 2020-04-08 ENCOUNTER — Ambulatory Visit: Payer: Medicare Other | Admitting: Physical Therapy

## 2020-04-08 ENCOUNTER — Encounter: Payer: Self-pay | Admitting: Physical Therapy

## 2020-04-08 ENCOUNTER — Other Ambulatory Visit: Payer: Self-pay

## 2020-04-08 DIAGNOSIS — M25511 Pain in right shoulder: Secondary | ICD-10-CM | POA: Diagnosis not present

## 2020-04-08 DIAGNOSIS — M6281 Muscle weakness (generalized): Secondary | ICD-10-CM

## 2020-04-08 DIAGNOSIS — G8929 Other chronic pain: Secondary | ICD-10-CM

## 2020-04-08 DIAGNOSIS — M25611 Stiffness of right shoulder, not elsewhere classified: Secondary | ICD-10-CM

## 2020-04-08 NOTE — Therapy (Addendum)
[facility] [address], [place], [place], [address] Phone: [phone]   Fax:  [phone]  Physical Therapy Treatment  Patient Details  Name: Joanna Cuevas MRN: [id number] Date of Birth: June 19, 1966 Referring Provider (PT): Joanne Chars, MD   Encounter Date: 04/08/2020   PT End of Session - 04/08/20 1234    Visit Number 4    Date for PT Re-Evaluation 04/30/20    Authorization Type UHC Medicare/Medicaid    PT Start Time 1232    PT Stop Time 1306    PT Time Calculation (min) 34 min    Activity Tolerance Patient tolerated treatment well    Behavior During Therapy [facility] for tasks assessed/performed           Past Medical History:  Diagnosis Date  . Chronic pain syndrome   . Diabetes mellitus without complication ([place])    Type II  . Genital herpes simplex   . Hypertension   . Kidney stones   . Morbid obesity ([place])   . OSA on CPAP     Past Surgical History:  Procedure Laterality Date  . ABDOMINAL HYSTERECTOMY    . ANAL FISSURECTOMY    . Bladder Tuck    . BREAST LUMPECTOMY  1992   benign - removed fatty tissue under both arms   . CESAREAN SECTION    . CHOLECYSTECTOMY    . ENDOMETRIAL ABLATION    . ENDOVENOUS ABLATION SAPHENOUS VEIN W/ LASER    . HERNIA REPAIR    . KIDNEY STONE SURGERY    . TONSILLECTOMY    . TOTAL VAGINAL HYSTERECTOMY    . TUBAL LIGATION      There were no vitals filed for this visit.   Subjective Assessment - 04/08/20 1235    Subjective No pain, did well after last session.    Pertinent History chronic LBP, Rt LE pain    Currently in Pain? No/denies              [facility] PT Assessment - 04/08/20 0001      Observation/Other Assessments   Focus on Therapeutic Outcomes (FOTO)  41% limitation      AROM   Right Shoulder Flexion 135 Degrees    Right Shoulder ABduction 100 Degrees    Right Shoulder External Rotation --   Touches C7-8     Strength   Right Shoulder Flexion  4-/5    Right Shoulder ABduction 3+/5    Right Shoulder Internal Rotation 4-/5                         [facility] Adult PT Treatment/Exercise - 04/08/20 0001      Shoulder Exercises: Seated   Flexion Strengthening;Right;10 reps;Weights   Scaption 1# 10x   Flexion Weight (lbs) 1    Other Seated Exercises hands clasped flexion over head 10x   added 1# wt   Other Seated Exercises green loop band 10x ER  5 sec hold      Shoulder Exercises: Pulleys   Flexion 3 minutes      Shoulder Exercises: ROM/Strengthening   UBE (Upper Arm Bike) L1 2x2 to en dsession with PTA present to monitor pain.                     PT Short Term Goals - 03/31/20 1225      PT SHORT TERM GOAL #1   Title be indpendent in initial HEP  Status Achieved      PT SHORT TERM GOAL #2   Title demonstrate Rt shoulder flexion A/ROM to > or = to 100 degrees to improve overhead reaching    Status Achieved      PT SHORT TERM GOAL #3   Title report a 25% reduction in Rt shoulder pain to improve sleep and use of the Rt UE with self-care    Status Achieved             PT Long Term Goals - 04/08/20 1245      PT LONG TERM GOAL #3   Title report a 60% reduction in Rt shoulder pain with sleep and use    Time 8    Period Weeks    Status On-going   50%                Plan - 04/08/20 1235    Clinical Impression Statement Pain 50% improved, strength slightly improved in all but abduction, FOTO score about the same. Pt will be out of town till end of August. Pt independnent in HEP she will do while she is away.    Personal Factors and Comorbidities Comorbidity 2    Comorbidities chronic LBP and Rt LE pain    Examination-Activity Limitations Carry;Lift;Reach Overhead;Sleep    Examination-Participation Restrictions Driving;Shop;Meal Prep    Stability/Clinical Decision Making Stable/Uncomplicated    PT Frequency 2x / week    PT Duration 8 weeks    PT Treatment/Interventions ADLs/Self Care  Home Management;Cryotherapy;Electrical Stimulation;Moist Heat;Therapeutic activities;Therapeutic exercise;Neuromuscular re-education;Manual techniques;Patient/family education;Passive range of motion;Dry needling;Taping    PT Next Visit Plan Pt out of town tull end of August. Pt said she will call us when she returns end of August, [place] if she fails to do so.    PT Home Exercise Plan Access Code: [email or web address]    Consulted and Agree with Plan of Care Patient           Patient will benefit from skilled therapeutic intervention in order to improve the following deficits and impairments:  Decreased activity tolerance, Decreased strength, Postural dysfunction, Improper body mechanics, Impaired flexibility, Hypomobility, Pain, Increased muscle spasms, Decreased endurance, Decreased range of motion, Difficulty walking  Visit Diagnosis: Chronic right shoulder pain  Stiffness of right shoulder, not elsewhere classified  Muscle weakness (generalized)     Problem List There are no problems to display for this patient.   Kendrix Orman, PTA 04/08/2020, 1:09 PM PHYSICAL THERAPY DISCHARGE SUMMARY  Visits from Start of Care: 4  Current functional level related to goals / functional outcomes: See above for current status.  Pt didn't return after last session.     Remaining deficits: See above for current status.     Education / Equipment: HEP Plan: Patient agrees to discharge.  Patient goals were partially met. Patient is being discharged due to not returning since the last visit.  ?????        Sigurd Sos, PT 08/26/20 8:56 AM  [facility] [address] [place], [place], [address] Phone: [phone]   Fax:  [phone]  Name: Joanna Cuevas MRN: [id number] Date of Birth: [birth year]

## 2020-04-13 ENCOUNTER — Ambulatory Visit: Payer: Medicare Other | Admitting: Physical Therapy

## 2020-04-15 ENCOUNTER — Ambulatory Visit: Payer: Medicare Other | Admitting: Physical Therapy

## 2020-06-02 ENCOUNTER — Other Ambulatory Visit: Payer: Self-pay | Admitting: Urology

## 2020-06-04 NOTE — Progress Notes (Signed)
DUE TO COVID-19 ONLY ONE VISITOR IS ALLOWED TO COME WITH YOU AND STAY IN THE WAITING ROOM ONLY DURING PRE OP AND PROCEDURE DAY OF SURGERY. THE 1 VISITOR  MAY VISIT WITH YOU AFTER SURGERY IN YOUR PRIVATE ROOM DURING VISITING HOURS ONLY!  YOU NEED TO HAVE A COVID 19 TEST ON__9/20/21 _____ @_______ , THIS TEST MUST BE DONE BEFORE SURGERY,  COVID TESTING SITE [address] [place] , IT IS ON THE RIGHT GOING OUT [address] APPROXIMATELY  2 MINUTES PAST [employer] ON THE RIGHT. ONCE YOUR COVID TEST IS COMPLETED,  PLEASE BEGIN THE QUARANTINE INSTRUCTIONS AS OUTLINED IN YOUR HANDOUT.                Joanna Cuevas  06/04/2020   Your procedure is scheduled on: 06/11/20   Report to [facility] Main  Entrance   Report to admitting at    0530 AM     Call this number if you have problems the morning of surgery [phone]    Remember: Do not eat food , candy gum or mints :After Midnight. You may have clear liquids from midnight until 0430am    CLEAR LIQUID DIET   Foods Allowed                                                                       Coffee and tea, regular and decaf                              Plain Jell-O any favor except red or purple                                            Fruit ices (not with fruit pulp)                                      Iced Popsicles                                     Carbonated beverages, regular and diet                                    Cranberry, grape and apple juices Sports drinks like Gatorade Lightly seasoned clear broth or consume(fat free) Sugar, honey syrup   _____________________________________________________________________    BRUSH YOUR TEETH MORNING OF SURGERY AND RINSE YOUR MOUTH OUT, NO CHEWING GUM CANDY OR MINTS.     Take these medicines the morning of surgery with A SIP OF WATER:  Adderall, Gabapentin DO NOT TAKE ANY DIABETIC MEDICATIONS DAY OF YOUR SURGERY                                You may not have any metal on your body including hair  pins and              piercings  Do not wear jewelry, make-up, lotions, powders or perfumes, deodorant             Do not wear nail polish on your fingernails.  Do not shave  48 hours prior to surgery.              Men may shave face and neck.   Do not bring valuables to the hospital. [place].  Contacts, dentures or bridgework may not be worn into surgery.  Leave suitcase in the car. After surgery it may be brought to your room.     Patients discharged the day of surgery will not be allowed to drive home. IF YOU ARE HAVING SURGERY AND GOING HOME THE SAME DAY, YOU MUST HAVE AN ADULT TO DRIVE YOU HOME AND BE WITH YOU FOR 24 HOURS. YOU MAY GO HOME BY TAXI OR UBER OR ORTHERWISE, BUT AN ADULT MUST ACCOMPANY YOU HOME AND STAY WITH YOU FOR 24 HOURS.  Name and phone number of your driver:  Special Instructions: N/A              Please read over the following fact sheets you were given: _____________________________________________________________________  [facility] - Preparing for Surgery Before surgery, you can play an important role.  Because skin is not sterile, your skin needs to be as free of germs as possible.  You can reduce the number of germs on your skin by washing with CHG (chlorahexidine gluconate) soap before surgery.  CHG is an antiseptic cleaner which kills germs and bonds with the skin to continue killing germs even after washing. Please DO NOT use if you have an allergy to CHG or antibacterial soaps.  If your skin becomes reddened/irritated stop using the CHG and inform your nurse when you arrive at Short Stay. Do not shave (including legs and underarms) for at least 48 hours prior to the first CHG shower.  You may shave your face/neck. Please follow these instructions carefully:  1.  Shower with CHG Soap the night before surgery and the  morning of Surgery.  2.  If you  choose to wash your hair, wash your hair first as usual with your  normal  shampoo.  3.  After you shampoo, rinse your hair and body thoroughly to remove the  shampoo.                           4.  Use CHG as you would any other liquid soap.  You can apply chg directly  to the skin and wash                       Gently with a scrungie or clean washcloth.  5.  Apply the CHG Soap to your body ONLY FROM THE NECK DOWN.   Do not use on face/ open                           Wound or open sores. Avoid contact with eyes, ears mouth and genitals (private parts).                       Wash face,  Genitals (private parts) with your  normal soap.             6.  Wash thoroughly, paying special attention to the area where your surgery  will be performed.  7.  Thoroughly rinse your body with warm water from the neck down.  8.  DO NOT shower/wash with your normal soap after using and rinsing off  the CHG Soap.                9.  Pat yourself dry with a clean towel.            10.  Wear clean pajamas.            11.  Place clean sheets on your bed the night of your first shower and do not  sleep with pets. Day of Surgery : Do not apply any lotions/deodorants the morning of surgery.  Please wear clean clothes to the hospital/surgery center.  FAILURE TO FOLLOW THESE INSTRUCTIONS MAY RESULT IN THE CANCELLATION OF YOUR SURGERY PATIENT SIGNATURE_________________________________  NURSE SIGNATURE__________________________________  ________________________________________________________________________

## 2020-06-04 NOTE — Progress Notes (Deleted)
DUE TO COVID-19 ONLY ONE VISITOR IS ALLOWED TO COME WITH YOU AND STAY IN THE WAITING ROOM ONLY DURING PRE OP AND PROCEDURE DAY OF SURGERY. THE 1 VISITOR  MAY VISIT WITH YOU AFTER SURGERY IN YOUR PRIVATE ROOM DURING VISITING HOURS ONLY!  YOU NEED TO HAVE A COVID 19 TEST ON_______ @_______ , THIS TEST MUST BE DONE BEFORE SURGERY,  COVID TESTING SITE [address] [place] , IT IS ON THE RIGHT GOING OUT [address] APPROXIMATELY  2 MINUTES PAST [employer] ON THE RIGHT. ONCE YOUR COVID TEST IS COMPLETED,  PLEASE BEGIN THE QUARANTINE INSTRUCTIONS AS OUTLINED IN YOUR HANDOUT.                [name]  06/04/2020   Your procedure is scheduled on:    Report to [facility] Main  Entrance   Report to admitting at AM     Call this number if you have problems the morning of surgery [phone]    Remember: Do not eat food , candy gum or mints :After Midnight. You may have clear liquids from midnight until     CLEAR LIQUID DIET   Foods Allowed                                                                       Coffee and tea, regular and decaf                              Plain Jell-O any favor except red or purple                                            Fruit ices (not with fruit pulp)                                      Iced Popsicles                                     Carbonated beverages, regular and diet                                    Cranberry, grape and apple juices Sports drinks like Gatorade Lightly seasoned clear broth or consume(fat free) Sugar, honey syrup   _____________________________________________________________________    BRUSH YOUR TEETH MORNING OF SURGERY AND RINSE YOUR MOUTH OUT, NO CHEWING GUM CANDY OR MINTS.     Take these medicines the morning of surgery with A SIP OF WATER:   DO NOT TAKE ANY DIABETIC MEDICATIONS DAY OF YOUR SURGERY                               You may not have any metal on your body  including hair pins and  piercings  Do not wear jewelry, make-up, lotions, powders or perfumes, deodorant             Do not wear nail polish on your fingernails.  Do not shave  48 hours prior to surgery.              Men may shave face and neck.   Do not bring valuables to the hospital. [place].  Contacts, dentures or bridgework may not be worn into surgery.  Leave suitcase in the car. After surgery it may be brought to your room.     Patients discharged the day of surgery will not be allowed to drive home. IF YOU ARE HAVING SURGERY AND GOING HOME THE SAME DAY, YOU MUST HAVE AN ADULT TO DRIVE YOU HOME AND BE WITH YOU FOR 24 HOURS. YOU MAY GO HOME BY TAXI OR UBER OR ORTHERWISE, BUT AN ADULT MUST ACCOMPANY YOU HOME AND STAY WITH YOU FOR 24 HOURS.  Name and phone number of your driver:  Special Instructions: N/A              Please read over the following fact sheets you were given: _____________________________________________________________________  [facility] - Preparing for Surgery Before surgery, you can play an important role.  Because skin is not sterile, your skin needs to be as free of germs as possible.  You can reduce the number of germs on your skin by washing with CHG (chlorahexidine gluconate) soap before surgery.  CHG is an antiseptic cleaner which kills germs and bonds with the skin to continue killing germs even after washing. Please DO NOT use if you have an allergy to CHG or antibacterial soaps.  If your skin becomes reddened/irritated stop using the CHG and inform your nurse when you arrive at Short Stay. Do not shave (including legs and underarms) for at least 48 hours prior to the first CHG shower.  You may shave your face/neck. Please follow these instructions carefully:  1.  Shower with CHG Soap the night before surgery and the  morning of Surgery.  2.  If you choose to wash your hair, wash your hair first  as usual with your  normal  shampoo.  3.  After you shampoo, rinse your hair and body thoroughly to remove the  shampoo.                           4.  Use CHG as you would any other liquid soap.  You can apply chg directly  to the skin and wash                       Gently with a scrungie or clean washcloth.  5.  Apply the CHG Soap to your body ONLY FROM THE NECK DOWN.   Do not use on face/ open                           Wound or open sores. Avoid contact with eyes, ears mouth and genitals (private parts).                       Wash face,  Genitals (private parts) with your normal soap.             6.  Wash thoroughly, paying special attention to the area where your surgery  will be performed.  7.  Thoroughly rinse your body with warm water from the neck down.  8.  DO NOT shower/wash with your normal soap after using and rinsing off  the CHG Soap.                9.  Pat yourself dry with a clean towel.            10.  Wear clean pajamas.            11.  Place clean sheets on your bed the night of your first shower and do not  sleep with pets. Day of Surgery : Do not apply any lotions/deodorants the morning of surgery.  Please wear clean clothes to the hospital/surgery center.  FAILURE TO FOLLOW THESE INSTRUCTIONS MAY RESULT IN THE CANCELLATION OF YOUR SURGERY PATIENT SIGNATURE_________________________________  NURSE SIGNATURE__________________________________  ________________________________________________________________________            DUE TO COVID-19 ONLY ONE VISITOR IS ALLOWED TO COME WITH YOU AND STAY IN THE WAITING ROOM ONLY DURING PRE OP AND PROCEDURE DAY OF SURGERY. THE 1 VISITOR  MAY VISIT WITH YOU AFTER SURGERY IN YOUR PRIVATE ROOM DURING VISITING HOURS ONLY!  YOU NEED TO HAVE A COVID 19 TEST ON_9/20/21 ______ @_______ , THIS TEST MUST BE DONE BEFORE SURGERY,  COVID TESTING SITE [address] [place] , IT IS ON THE RIGHT GOING OUT [address]  APPROXIMATELY  2 MINUTES PAST [employer] ON THE RIGHT. ONCE YOUR COVID TEST IS COMPLETED,  PLEASE BEGIN THE QUARANTINE INSTRUCTIONS AS OUTLINED IN YOUR HANDOUT.                Joanna Cuevas  06/04/2020   Your procedure is scheduled on: 06/11/20   Report to [facility] Main  Entrance   Report to admitting at    0530 AM     Call this number if you have problems the morning of surgery [phone]    Remember: Do not eat food , candy gum or mints :After Midnight. You may have clear liquids from midnight until 0430AM    CLEAR LIQUID DIET   Foods Allowed                                                                       Coffee and tea, regular and decaf                              Plain Jell-O any favor except red or purple                                            Fruit ices (not with fruit pulp)                                      Iced Popsicles  Carbonated beverages, regular and diet                                    Cranberry, grape and apple juices Sports drinks like Gatorade Lightly seasoned clear broth or consume(fat free) Sugar, honey syrup   _____________________________________________________________________    BRUSH YOUR TEETH MORNING OF SURGERY AND RINSE YOUR MOUTH OUT, NO CHEWING GUM CANDY OR MINTS.     Take these medicines the morning of surgery with A SIP OF WATER:  ADDERALL, GABAPENTIN DO NOT TAKE ANY DIABETIC MEDICATIONS DAY OF YOUR SURGERY                               You may not have any metal on your body including hair pins and              piercings  Do not wear jewelry, make-up, lotions, powders or perfumes, deodorant             Do not wear nail polish on your fingernails.  Do not shave  48 hours prior to surgery.              Men may shave face and neck.   Do not bring valuables to the hospital. [place] IS NOT             RESPONSIBLE   FOR VALUABLES.  Contacts, dentures or bridgework may not  be worn into surgery.  Leave suitcase in the car. After surgery it may be brought to your room.     Patients discharged the day of surgery will not be allowed to drive home. IF YOU ARE HAVING SURGERY AND GOING HOME THE SAME DAY, YOU MUST HAVE AN ADULT TO DRIVE YOU HOME AND BE WITH YOU FOR 24 HOURS. YOU MAY GO HOME BY TAXI OR UBER OR ORTHERWISE, BUT AN ADULT MUST ACCOMPANY YOU HOME AND STAY WITH YOU FOR 24 HOURS.  Name and phone number of your driver:  Special Instructions: N/A              Please read over the following fact sheets you were given: _____________________________________________________________________  [facility] - Preparing for Surgery Before surgery, you can play an important role.  Because skin is not sterile, your skin needs to be as free of germs as possible.  You can reduce the number of germs on your skin by washing with CHG (chlorahexidine gluconate) soap before surgery.  CHG is an antiseptic cleaner which kills germs and bonds with the skin to continue killing germs even after washing. Please DO NOT use if you have an allergy to CHG or antibacterial soaps.  If your skin becomes reddened/irritated stop using the CHG and inform your nurse when you arrive at Short Stay. Do not shave (including legs and underarms) for at least 48 hours prior to the first CHG shower.  You may shave your face/neck. Please follow these instructions carefully:  1.  Shower with CHG Soap the night before surgery and the  morning of Surgery.  2.  If you choose to wash your hair, wash your hair first as usual with your  normal  shampoo.  3.  After you shampoo, rinse your hair and body thoroughly to remove the  shampoo.  4.  Use CHG as you would any other liquid soap.  You can apply chg directly  to the skin and wash                       Gently with a scrungie or clean washcloth.  5.  Apply the CHG Soap to your body ONLY FROM THE NECK DOWN.   Do not use on face/ open                            Wound or open sores. Avoid contact with eyes, ears mouth and genitals (private parts).                       Wash face,  Genitals (private parts) with your normal soap.             6.  Wash thoroughly, paying special attention to the area where your surgery  will be performed.  7.  Thoroughly rinse your body with warm water from the neck down.  8.  DO NOT shower/wash with your normal soap after using and rinsing off  the CHG Soap.                9.  Pat yourself dry with a clean towel.            10.  Wear clean pajamas.            11.  Place clean sheets on your bed the night of your first shower and do not  sleep with pets. Day of Surgery : Do not apply any lotions/deodorants the morning of surgery.  Please wear clean clothes to the hospital/surgery center.  FAILURE TO FOLLOW THESE INSTRUCTIONS MAY RESULT IN THE CANCELLATION OF YOUR SURGERY PATIENT SIGNATURE_________________________________  NURSE SIGNATURE__________________________________  ________________________________________________________________________

## 2020-06-05 ENCOUNTER — Encounter (HOSPITAL_COMMUNITY)
Admission: RE | Admit: 2020-06-05 | Discharge: 2020-06-05 | Disposition: A | Discharge status: Home / Self Care | Payer: Medicare Other | Source: Ambulatory Visit | Attending: Urology | Admitting: Urology

## 2020-06-05 ENCOUNTER — Encounter (HOSPITAL_COMMUNITY): Payer: Self-pay

## 2020-06-05 ENCOUNTER — Other Ambulatory Visit: Payer: Self-pay

## 2020-06-05 DIAGNOSIS — Z01810 Encounter for preprocedural cardiovascular examination: Secondary | ICD-10-CM | POA: Diagnosis present

## 2020-06-05 DIAGNOSIS — Z01812 Encounter for preprocedural laboratory examination: Secondary | ICD-10-CM | POA: Diagnosis present

## 2020-06-05 HISTORY — DX: Anemia, unspecified: D64.9

## 2020-06-05 HISTORY — DX: Unspecified osteoarthritis, unspecified site: M19.90

## 2020-06-05 HISTORY — DX: Gastro-esophageal reflux disease without esophagitis: K21.9

## 2020-06-05 LAB — CBC
HCT: 35.5 % — ABNORMAL LOW (ref 36.0–46.0)
Hemoglobin: 11.3 g/dL — ABNORMAL LOW (ref 12.0–15.0)
MCH: 29.9 pg (ref 26.0–34.0)
MCHC: 31.8 g/dL (ref 30.0–36.0)
MCV: 93.9 fL (ref 80.0–100.0)
Platelets: 332 10*3/uL (ref 150–400)
RBC: 3.78 MIL/uL — ABNORMAL LOW (ref 3.87–5.11)
RDW: 13 % (ref 11.5–15.5)
WBC: 7.3 10*3/uL (ref 4.0–10.5)
nRBC: 0 % (ref 0.0–0.2)

## 2020-06-05 LAB — BASIC METABOLIC PANEL WITH GFR
Anion gap: 11 (ref 5–15)
BUN: 12 mg/dL (ref 6–20)
CO2: 31 mmol/L (ref 22–32)
Calcium: 9.2 mg/dL (ref 8.9–10.3)
Chloride: 98 mmol/L (ref 98–111)
Creatinine, Ser: 0.87 mg/dL (ref 0.44–1.00)
GFR calc Af Amer: >60 mL/min
GFR calc non Af Amer: >60 mL/min
Glucose, Bld: 104 mg/dL — ABNORMAL HIGH (ref 70–99)
Potassium: 4 mmol/L (ref 3.5–5.1)
Sodium: 140 mmol/L (ref 135–145)

## 2020-06-05 LAB — HEMOGLOBIN A1C
Hgb A1c MFr Bld: 6.1 % — ABNORMAL HIGH (ref 4.8–5.6)
Mean Plasma Glucose: 128.37 mg/dL

## 2020-06-05 NOTE — Progress Notes (Signed)
Patient reports at time of preop on 06/05/2020 that she was in [place]  From 04/07/20-05/25/2020.  Returned home to [place] on 05/26/2020.  Patient reports being in a house that someone had covid had been in but she was not in contact with that person at time she was in [place].   per patient.  Had covid test done 05/08/20 per pat which was negative .  Performed in [place] at [facility] when she developed kidney stone was was going to have surgery there but insurance would not approve.  Sheilah Mins made aware of above and no new orders given.

## 2020-06-05 NOTE — Progress Notes (Signed)
Seen by DR Chanetta Marshall at [facility] at [place] on 06/05/2020.  Called to request labs done there on 06/05/2020.  [facility] and requested lab results by fax to [phone].

## 2020-06-08 ENCOUNTER — Other Ambulatory Visit (HOSPITAL_COMMUNITY)
Admission: RE | Admit: 2020-06-08 | Discharge: 2020-06-08 | Disposition: A | Discharge status: Home / Self Care | Payer: Medicare Other | Source: Ambulatory Visit | Attending: Urology | Admitting: Urology

## 2020-06-08 DIAGNOSIS — Z20822 Contact with and (suspected) exposure to covid-19: Secondary | ICD-10-CM | POA: Insufficient documentation

## 2020-06-08 DIAGNOSIS — Z01812 Encounter for preprocedural laboratory examination: Secondary | ICD-10-CM | POA: Diagnosis present

## 2020-06-08 LAB — SARS CORONAVIRUS 2 (TAT 6-24 HRS): SARS Coronavirus 2: NEGATIVE

## 2020-06-10 MED ORDER — DEXTROSE 5 % IV SOLN
3.0000 g | INTRAVENOUS | Status: AC
  Administered 2020-06-11: 2 g via INTRAVENOUS
  Filled 2020-06-10: qty 3

## 2020-06-10 NOTE — Anesthesia Preprocedure Evaluation (Addendum)
Anesthesia Evaluation  Patient identified by MRN, date of birth, ID band Patient awake    Reviewed: Allergy & Precautions, NPO status , Patient's Chart, lab work & pertinent test results  Airway Mallampati: II  TM Distance: >3 FB Neck ROM: Full    Dental no notable dental hx. (+) Teeth Intact, Dental Advisory Given   Pulmonary sleep apnea and Continuous Positive Airway Pressure Ventilation ,    Pulmonary exam normal breath sounds clear to auscultation       Cardiovascular Exercise Tolerance: Good hypertension, Pt. on medications and Pt. on home beta blockers negative cardio ROS Normal cardiovascular exam Rhythm:Regular Rate:Normal     Neuro/Psych negative neurological ROS  negative psych ROS   GI/Hepatic Neg liver ROS, GERD  Medicated and Controlled,  Endo/Other  diabetes, Well Controlled, Type 2, Oral Hypoglycemic Agents  Renal/GU Renal diseaseK+ 4.0 Cr 0.87     Musculoskeletal  (+) Arthritis ,   Abdominal   Peds  Hematology hgb 11.3   Anesthesia Other Findings   Reproductive/Obstetrics                            Anesthesia Physical Anesthesia Plan  ASA: II  Anesthesia Plan: General   Post-op Pain Management:    Induction:   PONV Risk Score and Plan: Treatment may vary due to age or medical condition, Ondansetron, Dexamethasone and Midazolam  Airway Management Planned: Oral ETT  Additional Equipment: None  Intra-op Plan:   Post-operative Plan: Extubation in OR  Informed Consent: I have reviewed the patients History and Physical, chart, labs and discussed the procedure including the risks, benefits and alternatives for the proposed anesthesia with the patient or authorized representative who has indicated his/her understanding and acceptance.     Dental advisory given  Plan Discussed with: CRNA and Anesthesiologist  Anesthesia Plan Comments:       Anesthesia Quick  Evaluation

## 2020-06-11 ENCOUNTER — Encounter (HOSPITAL_COMMUNITY)
Admission: RE | Disposition: A | Discharge status: Home / Self Care | Payer: Self-pay | Source: Home / Self Care | Attending: Urology

## 2020-06-11 ENCOUNTER — Ambulatory Visit (HOSPITAL_COMMUNITY): Payer: Medicare Other | Admitting: Physician Assistant

## 2020-06-11 ENCOUNTER — Ambulatory Visit (HOSPITAL_COMMUNITY)
Admission: RE | Admit: 2020-06-11 | Discharge: 2020-06-11 | Disposition: A | Discharge status: Home / Self Care | Payer: Medicare Other | Attending: Urology | Admitting: Urology

## 2020-06-11 ENCOUNTER — Ambulatory Visit (HOSPITAL_COMMUNITY): Payer: Medicare Other | Admitting: Certified Registered"

## 2020-06-11 ENCOUNTER — Ambulatory Visit (HOSPITAL_COMMUNITY): Payer: Medicare Other

## 2020-06-11 DIAGNOSIS — N200 Calculus of kidney: Secondary | ICD-10-CM | POA: Diagnosis not present

## 2020-06-11 DIAGNOSIS — G4730 Sleep apnea, unspecified: Secondary | ICD-10-CM | POA: Diagnosis not present

## 2020-06-11 DIAGNOSIS — I10 Essential (primary) hypertension: Secondary | ICD-10-CM | POA: Insufficient documentation

## 2020-06-11 DIAGNOSIS — Z87442 Personal history of urinary calculi: Secondary | ICD-10-CM | POA: Diagnosis not present

## 2020-06-11 DIAGNOSIS — Z7984 Long term (current) use of oral hypoglycemic drugs: Secondary | ICD-10-CM | POA: Insufficient documentation

## 2020-06-11 DIAGNOSIS — F419 Anxiety disorder, unspecified: Secondary | ICD-10-CM | POA: Diagnosis not present

## 2020-06-11 DIAGNOSIS — E119 Type 2 diabetes mellitus without complications: Secondary | ICD-10-CM | POA: Diagnosis not present

## 2020-06-11 DIAGNOSIS — K219 Gastro-esophageal reflux disease without esophagitis: Secondary | ICD-10-CM | POA: Diagnosis not present

## 2020-06-11 DIAGNOSIS — Z79899 Other long term (current) drug therapy: Secondary | ICD-10-CM | POA: Insufficient documentation

## 2020-06-11 DIAGNOSIS — R319 Hematuria, unspecified: Secondary | ICD-10-CM | POA: Diagnosis not present

## 2020-06-11 DIAGNOSIS — E7800 Pure hypercholesterolemia, unspecified: Secondary | ICD-10-CM | POA: Insufficient documentation

## 2020-06-11 HISTORY — PX: CYSTOSCOPY WITH RETROGRADE PYELOGRAM, URETEROSCOPY AND STENT PLACEMENT: SHX5789

## 2020-06-11 LAB — GLUCOSE, CAPILLARY
Glucose-Capillary: 102 mg/dL — ABNORMAL HIGH (ref 70–99)
Glucose-Capillary: 139 mg/dL — ABNORMAL HIGH (ref 70–99)

## 2020-06-11 SURGERY — CYSTOURETEROSCOPY, WITH RETROGRADE PYELOGRAM AND STENT INSERTION
Anesthesia: General | Laterality: Bilateral

## 2020-06-11 MED ORDER — KETOROLAC TROMETHAMINE 30 MG/ML IJ SOLN
INTRAMUSCULAR | Status: AC
  Filled 2020-06-11: qty 1

## 2020-06-11 MED ORDER — OXYCODONE HCL 5 MG PO TABS: 5.0000 mg | ORAL_TABLET | Freq: Once | ORAL | Status: DC | PRN

## 2020-06-11 MED ORDER — AMISULPRIDE (ANTIEMETIC) 5 MG/2ML IV SOLN: 10.0000 mg | Freq: Once | INTRAVENOUS | Status: DC | PRN

## 2020-06-11 MED ORDER — MEPERIDINE HCL 50 MG/ML IJ SOLN: 6.2500 mg | INTRAMUSCULAR | Status: DC | PRN

## 2020-06-11 MED ORDER — DEXAMETHASONE SODIUM PHOSPHATE 10 MG/ML IJ SOLN
INTRAMUSCULAR | Status: DC | PRN
  Administered 2020-06-11: 8 mg via INTRAVENOUS

## 2020-06-11 MED ORDER — DEXAMETHASONE SODIUM PHOSPHATE 10 MG/ML IJ SOLN
INTRAMUSCULAR | Status: AC
  Filled 2020-06-11: qty 1

## 2020-06-11 MED ORDER — CIPROFLOXACIN HCL 500 MG PO TABS: 500.0000 mg | ORAL_TABLET | Freq: Once | ORAL | 0 refills | Status: AC

## 2020-06-11 MED ORDER — LIDOCAINE 2% (20 MG/ML) 5 ML SYRINGE
INTRAMUSCULAR | Status: AC
  Filled 2020-06-11: qty 5

## 2020-06-11 MED ORDER — ORAL CARE MOUTH RINSE: 15.0000 mL | Freq: Once | OROMUCOSAL | Status: AC

## 2020-06-11 MED ORDER — MIDAZOLAM HCL 2 MG/2ML IJ SOLN
INTRAMUSCULAR | Status: DC | PRN
  Administered 2020-06-11: 2 mg via INTRAVENOUS

## 2020-06-11 MED ORDER — KETOROLAC TROMETHAMINE 15 MG/ML IJ SOLN
INTRAMUSCULAR | Status: AC
  Filled 2020-06-11: qty 1

## 2020-06-11 MED ORDER — SODIUM CHLORIDE 0.9 % IR SOLN
Status: DC | PRN
  Administered 2020-06-11: 6000 mL

## 2020-06-11 MED ORDER — SUCCINYLCHOLINE CHLORIDE 20 MG/ML IJ SOLN
INTRAMUSCULAR | Status: DC | PRN
  Administered 2020-06-11: 100 mg via INTRAVENOUS

## 2020-06-11 MED ORDER — ONDANSETRON HCL 4 MG/2ML IJ SOLN
INTRAMUSCULAR | Status: DC | PRN
  Administered 2020-06-11: 4 mg via INTRAVENOUS

## 2020-06-11 MED ORDER — MIDAZOLAM HCL 2 MG/2ML IJ SOLN
INTRAMUSCULAR | Status: AC
  Filled 2020-06-11: qty 2

## 2020-06-11 MED ORDER — LACTATED RINGERS IV SOLN: INTRAVENOUS | Status: DC

## 2020-06-11 MED ORDER — OXYCODONE HCL 5 MG/5ML PO SOLN: 5.0000 mg | Freq: Once | ORAL | Status: DC | PRN

## 2020-06-11 MED ORDER — HYDROMORPHONE HCL 1 MG/ML IJ SOLN: 0.2500 mg | INTRAMUSCULAR | Status: DC | PRN

## 2020-06-11 MED ORDER — PROPOFOL 10 MG/ML IV BOLUS
INTRAVENOUS | Status: AC
  Filled 2020-06-11: qty 40

## 2020-06-11 MED ORDER — FENTANYL CITRATE (PF) 100 MCG/2ML IJ SOLN
INTRAMUSCULAR | Status: AC
  Filled 2020-06-11: qty 2

## 2020-06-11 MED ORDER — LIDOCAINE 2% (20 MG/ML) 5 ML SYRINGE
INTRAMUSCULAR | Status: DC | PRN
  Administered 2020-06-11: 100 mg via INTRAVENOUS

## 2020-06-11 MED ORDER — ONDANSETRON HCL 4 MG/2ML IJ SOLN: 4.0000 mg | Freq: Once | INTRAMUSCULAR | Status: DC | PRN

## 2020-06-11 MED ORDER — FENTANYL CITRATE (PF) 100 MCG/2ML IJ SOLN
INTRAMUSCULAR | Status: DC | PRN
Start: 2020-06-11 — End: 2020-06-11
  Administered 2020-06-11 (×4): 50 ug via INTRAVENOUS

## 2020-06-11 MED ORDER — SODIUM CHLORIDE 0.9 % IV SOLN
INTRAVENOUS | Status: DC | PRN
  Administered 2020-06-11: 20 mL

## 2020-06-11 MED ORDER — KETOROLAC TROMETHAMINE 30 MG/ML IJ SOLN
30.0000 mg | Freq: Once | INTRAMUSCULAR | Status: AC | PRN
  Administered 2020-06-11: 30 mg via INTRAVENOUS

## 2020-06-11 MED ORDER — CHLORHEXIDINE GLUCONATE 0.12 % MT SOLN
15.0000 mL | Freq: Once | OROMUCOSAL | Status: AC
  Administered 2020-06-11: 15 mL via OROMUCOSAL

## 2020-06-11 MED ORDER — ONDANSETRON HCL 4 MG/2ML IJ SOLN
INTRAMUSCULAR | Status: AC
  Filled 2020-06-11: qty 2

## 2020-06-11 MED ORDER — PROPOFOL 10 MG/ML IV BOLUS
INTRAVENOUS | Status: DC | PRN
  Administered 2020-06-11: 80 mg via INTRAVENOUS
  Administered 2020-06-11: 200 mg via INTRAVENOUS

## 2020-06-11 MED ORDER — HYDROCODONE-ACETAMINOPHEN 7.5-325 MG PO TABS: 1.0000 | ORAL_TABLET | Freq: Four times a day (QID) | ORAL | 0 refills | Status: AC | PRN

## 2020-06-11 MED ORDER — SUCCINYLCHOLINE CHLORIDE 200 MG/10ML IV SOSY
PREFILLED_SYRINGE | INTRAVENOUS | Status: AC
  Filled 2020-06-11: qty 10

## 2020-06-11 MED ORDER — PHENAZOPYRIDINE HCL 200 MG PO TABS: 200.0000 mg | ORAL_TABLET | Freq: Three times a day (TID) | ORAL | 0 refills | Status: DC | PRN

## 2020-06-11 SURGICAL SUPPLY — 20 items
BAG URO CATCHER STRL LF (MISCELLANEOUS) ×3 IMPLANT
BASKET ZERO TIP NITINOL 2.4FR (BASKET) IMPLANT
CATH URET 5FR 28IN OPEN ENDED (CATHETERS) ×3 IMPLANT
CLOTH BEACON ORANGE TIMEOUT ST (SAFETY) ×3 IMPLANT
EXTRACTOR STONE 1.7FRX115CM (UROLOGICAL SUPPLIES) ×3 IMPLANT
FIBER LASER TRACTIP 200 (UROLOGICAL SUPPLIES) ×3 IMPLANT
GLOVE BIOGEL M STRL SZ7.5 (GLOVE) ×3 IMPLANT
GOWN STRL REUS W/TWL XL LVL3 (GOWN DISPOSABLE) ×3 IMPLANT
GUIDEWIRE ANG ZIPWIRE 038X150 (WIRE) IMPLANT
GUIDEWIRE STR DUAL SENSOR (WIRE) ×6 IMPLANT
KIT TURNOVER KIT A (KITS) IMPLANT
MANIFOLD NEPTUNE II (INSTRUMENTS) ×3 IMPLANT
PACK CYSTO (CUSTOM PROCEDURE TRAY) ×3 IMPLANT
SHEATH URETERAL 12FRX28CM (UROLOGICAL SUPPLIES) IMPLANT
SHEATH URETERAL 12FRX35CM (MISCELLANEOUS) ×3 IMPLANT
STENT URET 6FRX24 CONTOUR (STENTS) ×3 IMPLANT
SYR CONTROL 10ML LL (SYRINGE) ×3 IMPLANT
TUBING CONNECTING 10 (TUBING) ×2 IMPLANT
TUBING CONNECTING 10' (TUBING) ×1
TUBING UROLOGY SET (TUBING) ×3 IMPLANT

## 2020-06-11 NOTE — Interval H&P Note (Signed)
History and Physical Interval Note:  06/11/2020 7:28 AM  Joanna Cuevas  has presented today for surgery, with the diagnosis of GROSS HEMATURIA FLANK PAIN.  The various methods of treatment have been discussed with the patient and family. After consideration of risks, benefits and other options for treatment, the patient has consented to  Procedure(s): CYSTOSCOPY WITH BILATERAL RETROGRADE PYELOGRAM, LEFT URETEROSCOPY LASER LITHOTRIPSYAND STENT PLACEMENT (Bilateral) as a surgical intervention.  The patient's history has been reviewed, patient examined, no change in status, stable for surgery.  I have reviewed the patient's chart and labs.  Questions were answered to the patient's satisfaction.     Crist Fat

## 2020-06-11 NOTE — Transfer of Care (Signed)
Immediate Anesthesia Transfer of Care Note  Patient: Danae Chen  Procedure(s) Performed: CYSTOSCOPY WITH BILATERAL RETROGRADE PYELOGRAM, LEFT URETEROSCOPY LASER LITHOTRIPSY AND STENT PLACEMENT, LEFT ENDOPYELOTOMY (Bilateral )  Patient Location: PACU  Anesthesia Type:General  Level of Consciousness: awake, alert  and patient cooperative  Airway & Oxygen Therapy: Patient Spontanous Breathing and Patient connected to face mask oxygen  Post-op Assessment: Report given to RN and Post -op Vital signs reviewed and stable  Post vital signs: Reviewed and stable  Last Vitals:  Vitals Value Taken Time  BP    Temp    Pulse 91 06/11/20 0939  Resp 17 06/11/20 0939  SpO2 99 % 06/11/20 0939  Vitals shown include unvalidated device data.  Last Pain:  Vitals:   06/11/20 0600  TempSrc: Oral         Complications: No complications documented.

## 2020-06-11 NOTE — Anesthesia Procedure Notes (Signed)
Procedure Name: Intubation Date/Time: 06/11/2020 7:47 AM Performed by: Eben Burow, CRNA Pre-anesthesia Checklist: Patient identified, Emergency Drugs available, Suction available, Patient being monitored and Timeout performed Patient Re-evaluated:Patient Re-evaluated prior to induction Oxygen Delivery Method: Circle system utilized Preoxygenation: Pre-oxygenation with 100% oxygen Induction Type: IV induction Laryngoscope Size: Mac and 4 Grade View: Grade II Tube type: Oral Number of attempts: 1 Airway Equipment and Method: Stylet Placement Confirmation: ETT inserted through vocal cords under direct vision,  positive ETCO2 and breath sounds checked- equal and bilateral Secured at: 22 cm Tube secured with: Tape Dental Injury: Teeth and Oropharynx as per pre-operative assessment

## 2020-06-11 NOTE — H&P (Signed)
54 year old [place] American female with a history of nephrolithiasis who presents today for re-evaluation.   The patient recently moved here from [place]. In [place] she has a urologist in she has been treated for kidney stones with ureteroscopy. She was down in [place] in late July visiting family and noticed gross hematuria. Two and half weeks later she developed left flank pain. She subsequently presented to her former urologist who is performed a CT scan. I read the report of this which demonstrates some mild hydro, but no obstructing stones. She did have 2 left stones, 1 at the UPJ. There was also a small nonobstructing stone on the right. The patient has had ongoing flank pain in her left flank radiating around to her left groin. She has not had any worsening urinary tract symptoms including frequency or urgency. She has had persistent gross hematuria.     ALLERGIES: None   MEDICATIONS: Metformin Hcl  Simvastatin  Albuterol Sulfate  Amitiza  Amphetamine  Bumetanide  Flonase Allergy Relief  Gabapentin  Hydrocodone-Acetaminophen 5 mg-325 mg tablet  Klor-Con  Mirtazapine  Pantoprazole Sodium  Premarin  Trazodone Hcl  Valacyclovir     GU PSH: Cysto Uretero Lithotripsy Hysterectomy       PSH Notes: Bladder Tick   NON-GU PSH: Biopsy Soft Tissues Cesarean Delivery Remove Gallbladder Repair of anal fissure Tonsillectomy     GU PMH: None   NON-GU PMH: Anxiety Arthritis Diabetes Type 2 GERD Hypercholesterolemia Hypertension    FAMILY HISTORY: 1 Daughter - Daughter 1 son - Son Breast Cancer - Mother, Aunt Diabetes - Runs in Family Hypercholesterolemia - Runs in Family Hypertension - Runs in Family Prostate Cancer - Brother, Father   SOCIAL HISTORY: Marital Status: Married Preferred Language: English; Ethnicity: Not Hispanic Or Latino; Race: Black or African American Current Smoking Status: Patient has never smoked.   Tobacco Use Assessment Completed: Used  Tobacco in last 30 days? Does not use smokeless tobacco. Has never drank.  Does not use drugs. Does not drink caffeine. Has not had a blood transfusion.    REVIEW OF SYSTEMS:    GU Review Female:   Patient reports burning /pain with urination, leakage of urine, frequent urination, and have to strain to urinate. Patient denies hard to postpone urination, stream starts and stops, trouble starting your stream, get up at night to urinate, and being pregnant.  Gastrointestinal (Upper):   Patient reports indigestion/ heartburn. Patient denies nausea and vomiting.  Gastrointestinal (Lower):   Patient reports diarrhea. Patient denies constipation.  Constitutional:   Patient reports night sweats and fatigue. Patient denies fever and weight loss.  Skin:   Patient denies skin rash/ lesion and itching.  Eyes:   Patient denies blurred vision and double vision.  Ears/ Nose/ Throat:   Patient denies sore throat and sinus problems.  Hematologic/Lymphatic:   Patient denies swollen glands and easy bruising.  Cardiovascular:   Patient denies leg swelling and chest pains.  Respiratory:   Patient denies cough and shortness of breath.  Endocrine:   Patient denies excessive thirst.  Musculoskeletal:   Patient reports back pain. Patient denies joint pain.  Neurological:   Patient reports headaches and dizziness.   Psychologic:   Patient denies depression and anxiety.   VITAL SIGNS:      05/27/2020 08:32 AM  Weight 331 lb / 150.14 kg  Height 66 in / 167.64 cm  BP 145/82 mmHg  Heart Rate 88 /min  Temperature 97.2 F / 36.2 C  BMI 53.4 kg/m  MULTI-SYSTEM PHYSICAL EXAMINATION:    Constitutional: Obese. No physical deformities. Normally developed. Good grooming.   Neck: Neck symmetrical, not swollen. Normal tracheal position.  Respiratory: Normal breath sounds. No labored breathing, no use of accessory muscles.   Cardiovascular: Regular rate and rhythm. No murmur, no gallop. Normal temperature, normal  extremity pulses, no swelling, no varicosities.   Lymphatic: No enlargement of neck, axillae, groin.  Skin: No paleness, no jaundice, no cyanosis. No lesion, no ulcer, no rash.  Neurologic / Psychiatric: Oriented to time, oriented to place, oriented to person. No depression, no anxiety, no agitation.  Gastrointestinal: No mass, no tenderness, no rigidity. Left flank and lower quadrant pain  Eyes: Normal conjunctivae. Normal eyelids.  Ears, Nose, Mouth, and Throat: Left ear no scars, no lesions, no masses. Right ear no scars, no lesions, no masses. Nose no scars, no lesions, no masses. Normal hearing. Normal lips.  Musculoskeletal: Normal gait and station of head and neck.     Complexity of Data:  Records Review:   Previous Doctor Records, Previous Patient Records  Urine Test Review:   Urinalysis  X-Ray Review: C.T. Abdomen/Pelvis: Reviewed Report. Discussed With Patient.     PROCEDURES:         C.T. Hematuria - 31497, [id number]      OMNIPAQUE 300 125CC Patient confirmed No Neulasta OnPro Device.           Urinalysis Dipstick Dipstick Cont'd  Color: Yellow Bilirubin: Neg mg/dL  Appearance: Clear Ketones: Neg mg/dL  Specific Gravity: 8.588 Blood: Neg ery/uL  pH: 6.0 Protein: Neg mg/dL  Glucose: Neg mg/dL Urobilinogen: 0.2 mg/dL    Nitrites: Neg    Leukocyte Esterase: Neg leu/uL    ASSESSMENT:      ICD-10 Details  1 GU:   Gross hematuria - R31.0   2   Renal calculus - N20.0    PLAN:           Orders Labs BUN/Creatinine(Stat)  X-Rays: C.T. Hematuria With and Without I.V. Contrast  X-Ray Notes: History:  Hematuria: Yes/No  Patient to see MD after exam: Yes/No  Previous exam: CT / IVP/ US/ KUB/ None  When:  Where:  Diabetic: Yes/ No  BUN/ Creatine:  Date of last BUN Creatinine:  Weight in pounds:  Allergy- Contrasts/ Shellfish: Yes/ No  Conflicting diabetic meds: Yes/ No  Oral contrast and instructions given to patient:   Prior Authorization #: NPCR ref #  [id number]            Schedule         Document Letter(s):  Created for Patient: Clinical Summary         Notes:   The patient's has left flank and lower quadrant pain and gross hematuria. She has stones in the left kidney that are nonobstructing.   I discussed the clinical circumstance with the patient. I do not think that the kidney stones or contributing to her pain, I also do not think that the hematuria is necessary early related to her stones. I recommended that we evaluate this more completely under anesthesia. Specifically I recommended cystoscopy, bilateral retrograde pyelograms, and left diagnostic ureteroscopy. At the same time, I also discussed removing the patient's stones on the left side if I am able to. Having gone through the surgery, and discussed it in detail with both the patient and her husband, she has opted to proceed.

## 2020-06-11 NOTE — Op Note (Signed)
Preoperative diagnosis:  1. Hematuria and left flank pain 2. Left nonobstructing kidney stones  Postoperative diagnosis:  1. Same  Procedure: 1. Cystoscopy, bilateral retrograde pyelograms with interpretation 2. Left ureteroscopy, laser lithotripsy stone extraction 3. Left laser endopyelotomy 4. Left ureteral stent placement  Surgeon: Crist Fat, MD  Anesthesia: General  Complications: None  Intraoperative findings:  #1: The patient's right retrograde pyelogram was performed with a 5 [place] open-ended ureteral catheter and demonstrated a normal caliber ureter with no filling defects or hydroureteronephrosis. #2: The patient's right retrograde pyelogram demonstrated a normal caliber ureter with a narrowing at the UPJ and some mild left-sided ureteral dilatation.  There were no appreciable filling defects. #3: The patient's left UPJ was stenosed/scarred.  At the end of the case I did laser this posterior-medially. #4: There were 2 large nonobstructing stones in the mid and lower pole calyces.  There is a smaller stone in the upper pole.  These were fragmented and removed entirely. #5: At the end of the case there was a fair amount of bleeding obscuring visualization.  There were no appreciable stone fragments remaining.  EBL: Minimal  Specimens: None  Indication: Joanna Cuevas is a 54 y.o. patient with history of kidney stones.  She had a CT scan at outside facility that demonstrated hydronephrosis but no kidney stones.  She had persistent hematuria and flank pain.  We performed a CT scan in our office which demonstrated no hydronephrosis or clear etiology of her hematuria, however she did have some large nonobstructing stones in the right kidney..  After reviewing the management options for treatment, he elected to proceed with the above surgical procedure(s). We have discussed the potential benefits and risks of the procedure, side effects of the proposed treatment, the likelihood  of the patient achieving the goals of the procedure, and any potential problems that might occur during the procedure or recuperation. Informed consent has been obtained.  Description of procedure:  The patient was taken to the operating room and general anesthesia was induced.  The patient was placed in the dorsal lithotomy position, prepped and draped in the usual sterile fashion, and preoperative antibiotics were administered. A preoperative time-out was performed.   21 French 30 degree cystoscope was gently passed to the patient's urethra and in the bladder under visual guidance.  Cystoscopy was performed during associated fashion demonstrating a normal bladder mucosa with no stones, tumors, or abnormalities.  The ureters were orthotopic.  I then perform retrograde pyelograms using a 5 [place] open-ended catheter and 10 cc of Omnipaque contrast.  I did this first on the right with the above findings.  I then repeated the retrograde on the left with the above findings.  I then advanced a wire through the open-ended catheter up into the left renal pelvis and remove the scope and the open-ended catheter over the wire.  I subsequently performed ureteroscopy with the semirigid scope up to the renal pelvis noting no significant abnormalities of the ureter.  The patient's renal pelvis was noted to be stenosed.  I slowly backed out the semirigid ureteroscope and advanced a wire through the scope prior to removing it from the patient.  I then advanced the 12/14 [place] ureteral access sheath up to the proximal ureter over the second wire removing the second wire and the inner portion of the sheath.  I then performed flexible ureteroscopy up in the patient's kidney noting the above findings.  The stones were fragmented and removed.  There was a significant  amount of bleeding towards the end of the procedure and was limiting, but there were no large remaining fragments that I was able to encounter at the end.  On the  way out of the kidney I did opt to perform a laser endopyelotomy on the posterior medial aspect of the ureter down to the fat and approximately 1 inch in length.  Removing this ureteroscope there were no other ureteral abnormalities noted.  I then advanced a 6 [place] x24 cm stent over the wire and into the patient's upper pole under fluoroscopic guidance.  Stent was noted to be nicely curled in the patient's upper pole and was then advanced to the urethral meatus before removing the wire entirely.  I then did advance the beak of the cystoscope into the patient's bladder and the distal curl popped into the patient's bladder with a nice curl.  This was confirmed on fluoroscopy.  The bladder was then drained.  The stent tether was then taped to the inner portion of the patient's left thigh.  Disposition: The patient was extubated and returned to the PACU, she is scheduled to be discharged home.  She will be instructed to remove the stent in 5 days.  Crist Fat, M.D.

## 2020-06-11 NOTE — Discharge Instructions (Signed)
DISCHARGE INSTRUCTIONS FOR KIDNEY STONE/URETERAL STENT   MEDICATIONS:  1.  Resume all your other meds from home - except do not take any extra narcotic pain meds that you may have at home.  2. Pyridium is to help with the burning/stinging when you urinate. 3. Vicodin is for moderate/severe pain, otherwise taking upto 1000 mg every 6 hours of plainTylenol will help treat your pain.   4. Take Cipro one hour prior to removal of your stent.   ACTIVITY:  1. No strenuous activity x 1week  2. No driving while on narcotic pain medications  3. Drink plenty of water  4. Continue to walk at home - you can still get blood clots when you are at home, so keep active, but don't over do it.  5. May return to work/school tomorrow or when you feel ready   BATHING:  1. You can shower and we recommend daily showers  2. You have a string coming from your urethra: The stent string is attached to your ureteral stent. Do not pull on this.   SIGNS/SYMPTOMS TO CALL:  Please call us if you have a fever greater than 101.5, uncontrolled nausea/vomiting, uncontrolled pain, dizziness, unable to urinate, bloody urine, chest pain, shortness of breath, leg swelling, leg pain, redness around wound, drainage from wound, or any other concerns or questions.   You can reach [place] at [phone].   FOLLOW-UP:  1. You have an appointment in 2 weeks with a ultrasound of your kidneys prior.   2. You have a string attached to your stent, you may remove it on Tuesday, September 28th. To do this, pull the strings until the stents are completely removed. You may feel an odd sensation in your back.

## 2020-06-11 NOTE — Anesthesia Postprocedure Evaluation (Signed)
Anesthesia Post Note  Patient: Joanna Cuevas  Procedure(s) Performed: CYSTOSCOPY WITH BILATERAL RETROGRADE PYELOGRAM, LEFT URETEROSCOPY LASER LITHOTRIPSY AND STENT PLACEMENT, LEFT ENDOPYELOTOMY (Bilateral )     Patient location during evaluation: PACU Anesthesia Type: General Level of consciousness: awake and alert Pain management: pain level controlled Vital Signs Assessment: post-procedure vital signs reviewed and stable Respiratory status: spontaneous breathing, nonlabored ventilation, respiratory function stable and patient connected to nasal cannula oxygen Cardiovascular status: blood pressure returned to baseline and stable Postop Assessment: no apparent nausea or vomiting Anesthetic complications: no   No complications documented.  Last Vitals:  Vitals:   06/11/20 1000 06/11/20 1030  BP: (!) 133/92 (!) 157/88  Pulse: 95 95  Resp: 14 12  Temp:  36.5 C  SpO2: 91% 93%    Last Pain:  Vitals:   06/11/20 1030  TempSrc:   PainSc: 3                  Trevor Iha

## 2020-06-11 NOTE — Anesthesia Procedure Notes (Signed)
Procedure Name: LMA Insertion Date/Time: 06/11/2020 7:36 AM Performed by: Sindy [place], CRNA Pre-anesthesia Checklist: Patient identified, Emergency Drugs available, Suction available, Patient being monitored and Timeout performed Patient Re-evaluated:Patient Re-evaluated prior to induction Oxygen Delivery Method: Circle system utilized Preoxygenation: Pre-oxygenation with 100% oxygen Induction Type: IV induction LMA: LMA inserted and LMA with gastric port inserted LMA Size: 4.0 Number of attempts: 1 Tube secured with: Tape Dental Injury: Teeth and Oropharynx as per pre-operative assessment

## 2020-06-12 ENCOUNTER — Encounter (HOSPITAL_COMMUNITY): Payer: Self-pay | Admitting: Urology

## 2020-08-27 ENCOUNTER — Ambulatory Visit (INDEPENDENT_AMBULATORY_CARE_PROVIDER_SITE_OTHER): Payer: Medicare Other | Admitting: Cardiology

## 2020-08-27 ENCOUNTER — Other Ambulatory Visit: Payer: Self-pay

## 2020-08-27 VITALS — BP 100/60 | HR 78 | Ht 66.0 in | Wt 338.0 lb

## 2020-08-27 DIAGNOSIS — R0602 Shortness of breath: Secondary | ICD-10-CM | POA: Diagnosis not present

## 2020-08-27 NOTE — Progress Notes (Signed)
Cardiology Office Note:    Date:  08/27/2020   ID:  Joanna Cuevas, DOB 1966/06/15, MRN [id number]  PCP:  [name], MD  [facility] Cardiologist:  Donato Schultz, MD  [facility] Electrophysiologist:  None   Referring MD: [name], *     History of Present Illness:    Joanna Cuevas is a 54 y.o. female here for the evaluation of dyspnea on exertion at the request of Dr. Chanetta Marshall.  Edema also worse.  Reviewed Dr. Christena Flake note from 08/18/2020.  Bumex reinstated.  BMI 55 echo in 2020 showed EF of 60%.  Dyspnea seems worse. Tired. Walking. More fatigue. Has to stop. Legs seemed to swell more. Feels a catch in her heart, Tightness. Muscle cramp type. Bad cramps in legs at night. Drives back and forth from [place].   Bumex M, W, F,. Watching salt. Craved salt though.   Also has shellfish allergy.  LDL 110 hemoglobin 11 creatinine 0.7  Wears [place] hat  Past Medical History:  Diagnosis Date  . ADHD   . Anemia   . Arthritis   . Chronic pain syndrome   . Diabetes mellitus without complication (HCC)    Type II  . Diastolic CHF (HCC)   . Disordered sleep   . DOE (dyspnea on exertion)   . Genital herpes simplex   . GERD (gastroesophageal reflux disease)   . HSV (herpes simplex virus) anogenital infection   . Hypercholesterolemia   . Hypertension   . Insomnia   . Kidney stones   . Morbid obesity (HCC)   . OSA on CPAP    cpap - does not know settings   . Peripheral neuropathy   . Shellfish allergy     Past Surgical History:  Procedure Laterality Date  . ABDOMINAL HYSTERECTOMY    . ANAL FISSURECTOMY    . Bladder Tuck    . BREAST LUMPECTOMY  1992   benign - removed fatty tissue under both arms   . CESAREAN SECTION    . CHOLECYSTECTOMY    . CYSTOSCOPY WITH RETROGRADE PYELOGRAM, URETEROSCOPY AND STENT PLACEMENT Bilateral 06/11/2020   Procedure: CYSTOSCOPY WITH BILATERAL RETROGRADE PYELOGRAM, LEFT URETEROSCOPY LASER LITHOTRIPSY AND STENT PLACEMENT,  LEFT ENDOPYELOTOMY;  Surgeon: Crist Fat, MD;  Location: [facility];  Service: Urology;  Laterality: Bilateral;  . ENDOMETRIAL ABLATION    . ENDOVENOUS ABLATION SAPHENOUS VEIN W/ LASER    . HERNIA REPAIR    . KIDNEY STONE SURGERY    . TONSILLECTOMY    . TOTAL VAGINAL HYSTERECTOMY    . TUBAL LIGATION      Current Medications: Current Meds  Medication Sig  . AMITIZA 24 MCG capsule Take 24 mcg by mouth daily.   Marland Kitchen amphetamine-dextroamphetamine (ADDERALL XR) 30 MG 24 hr capsule Take 30 mg by mouth daily.   . bumetanide (BUMEX) 1 MG tablet Take 2 mg by mouth every Monday, Wednesday, and Friday. In the morning.  . cholecalciferol (VITAMIN D) 25 MCG (1000 UNIT) tablet Take 1,000 Units by mouth daily.  Marland Kitchen ELDERBERRY PO Take 200 mg by mouth daily. 100 mg/per tablet  . ferrous sulfate 325 (65 FE) MG tablet Take 325 mg by mouth daily.  . fluticasone (FLONASE) 50 MCG/ACT nasal spray Place 1-2 sprays into both nostrils 3 (three) times daily as needed for allergies.  Marland Kitchen gabapentin (NEURONTIN) 300 MG capsule Take 600-1,200 mg by mouth See admin instructions. Take 2 capsules (600 mg) by mouth in the morning & take 4 capsules (1200  mg) by mouth at bedtime.  Marland Kitchen HYDROcodone-acetaminophen (NORCO) 7.5-325 MG tablet Take 1-2 tablets by mouth 4 (four) times daily as needed (pain.).  Marland Kitchen Lancets (ONETOUCH DELICA PLUS LANCET30G) MISC USE AS DIRECTED TO CHECK BLOOD SUGAR  . lisinopril-hydrochlorothiazide (ZESTORETIC) 20-25 MG tablet Take 2 tablets by mouth daily. In the morning  . metFORMIN (GLUCOPHAGE) 1000 MG tablet Take 1,000 mg by mouth in the morning and at bedtime.   . metoCLOPramide (REGLAN) 5 MG tablet Take 5 mg by mouth 2 (two) times daily as needed for nausea.   Letta Pate VERIO test strip USE STRIP TO CHECK GLUCOSE TWICE DAILY  . pantoprazole (PROTONIX) 40 MG tablet Take 40 mg by mouth daily as needed (acid reflux/indigestion.).   Marland Kitchen phenazopyridine (PYRIDIUM) 200 MG tablet Take 1 tablet (200 mg total) by  mouth 3 (three) times daily as needed for pain.  . potassium chloride (KLOR-CON) 10 MEQ tablet Take 10 mEq by mouth See admin instructions. Take 1 tablet (10 meq) by mouth twice daily every other day  . PREMARIN 0.625 MG tablet Take 0.625 mg by mouth at bedtime.   . RYBELSUS 7 MG TABS Take 7 mg by mouth daily.  . simvastatin (ZOCOR) 40 MG tablet Take 40 mg by mouth at bedtime.   . valACYclovir (VALTREX) 500 MG tablet Take 500 mg by mouth daily.   . vitamin B-12 (CYANOCOBALAMIN) 500 MCG tablet Take 500 mcg by mouth daily.     Allergies:   Shellfish allergy   Social History   Socioeconomic History  . Marital status: Married    Spouse name: Not on file  . Number of children: 2  . Years of education: 74  . Highest education level: Not on file  Occupational History  . Not on file  Tobacco Use  . Smoking status: Never Smoker  . Smokeless tobacco: Never Used  Vaping Use  . Vaping Use: Never used  Substance and Sexual Activity  . Alcohol use: Yes    Comment: occ   . Drug use: Never  . Sexual activity: Yes  Other Topics Concern  . Not on file  Social History Narrative  . Not on file   Social Determinants of Health   Financial Resource Strain: Not on file  Food Insecurity: Not on file  Transportation Needs: Not on file  Physical Activity: Not on file  Stress: Not on file  Social Connections: Not on file     Family History: The patient's family history includes Breast cancer in her maternal aunt.  ROS:   Please see the history of present illness.    No syncope no bleeding  all other systems reviewed and are negative.  EKGs/Labs/Other Studies Reviewed:    The following studies were reviewed today: ECHO 10/2019:  1. Left ventricular ejection fraction, by estimation, is 60 to 65%. The  left ventricle has normal function. The left ventricle has no regional  wall motion abnormalities. Left ventricular diastolic parameters were  normal.  2. Right ventricular systolic  function is normal. The right ventricular  size is normal. Tricuspid regurgitation signal is inadequate for assessing  PA pressure.  3. The mitral valve is normal in structure and function. No evidence of  mitral valve regurgitation. No evidence of mitral stenosis.  4. The aortic valve is tricuspid. Aortic valve regurgitation is not  visualized. No aortic stenosis is present.  5. The inferior vena cava is normal in size with greater than 50%  respiratory variability, suggesting right atrial pressure of  3 mmHg.   EKG: Sinus rhythm no other abnormalities on EKG 06/05/2020  Recent Labs: 06/05/2020: BUN 12; Creatinine, Ser 0.87; Hemoglobin 11.3; Platelets 332; Potassium 4.0; Sodium 140  Recent Lipid Panel No results found for: CHOL, TRIG, HDL, CHOLHDL, VLDL, LDLCALC, LDLDIRECT   Risk Assessment/Calculations:      Physical Exam:    VS:  BP 100/60 (BP Location: Left Arm, Patient Position: Sitting, Cuff Size: Large)   Pulse 78   Ht 5\' 6"  (1.676 m)   Wt (!) 338 lb (153.3 kg)   SpO2 97%   BMI 54.55 kg/m     Wt Readings from Last 3 Encounters:  08/27/20 (!) 338 lb (153.3 kg)  06/11/20 (!) 344 lb 9.6 oz (156.3 kg)  05/20/19 (!) 332 lb (150.6 kg)     GEN: Overweight well nourished, well developed in no acute distress HEENT: Normal NECK: No JVD; No carotid bruits LYMPHATICS: No lymphadenopathy CARDIAC: RRR, no murmurs, rubs, gallops RESPIRATORY:  Clear to auscultation without rales, wheezing or rhonchi  ABDOMEN: Soft, non-tender, non-distended MUSCULOSKELETAL:  No edema; No deformity  SKIN: Warm and dry NEUROLOGIC:  Alert and oriented x 3 PSYCHIATRIC:  Normal affect   ASSESSMENT:    1. Shortness of breath   2. Morbid obesity (HCC)    PLAN:    In order of problems listed above:  Dyspnea Morbid obesity -Multifactorial, weight, deconditioning.  Echocardiogram reassuring as noted above. -Agree with continued Bumex Monday Wednesday Friday.  Additional if needed. -With  her cramping, she does take her potassium when she takes the Bumex.  Also recommended magnesium supplement to 50 mg daily. -When she is driving back and forth to [place] where majority of her family is, please stop every 2 hours to move, stretch. -Ultimately, I do not think that any further cardiac testing is needed. -Continue with weight loss efforts.  Discussed decreasing carbohydrates, sugars.  I will see her back in 6 months to make sure she is making progress.  Creatinine 0.7 potassium 4.2 hemoglobin A1c 6.1 LDL 110     Shared Decision Making/Informed Consent        Medication Adjustments/Labs and Tests Ordered: Current medicines are reviewed at length with the patient today.  Concerns regarding medicines are outlined above.  No orders of the defined types were placed in this encounter.  No orders of the defined types were placed in this encounter.   Patient Instructions  Medication Instructions:  The current medical regimen is effective;  continue present plan and medications.  You make take an over the counter Magensium supplement for muscle cramps.  *If you need a refill on your cardiac medications before your next appointment, please call your pharmacy*  Please limit fluid intake to 1,500 ml a day.  (1 and 1/2 liters)  Follow-Up: At [facility], you and your health needs are our priority.  As part of our continuing mission to provide you with exceptional heart care, we have created designated Provider Care Teams.  These Care Teams include your primary Cardiologist (physician) and Advanced Practice Providers (APPs -  Physician Assistants and Nurse Practitioners) who all work together to provide you with the care you need, when you need it.  We recommend signing up for the patient portal called "MyChart".  Sign up information is provided on this After Visit Summary.  MyChart is used to connect with patients for Virtual Visits (Telemedicine).  Patients are able to view  lab/test results, encounter notes, upcoming appointments, etc.  Non-urgent messages can be  sent to your provider as well.   To learn more about what you can do with MyChart, go to [email or web address].    Your next appointment:   6 month(s)  The format for your next appointment:   In Person  Provider:   Donato SchultzMark Xion Debruyne, MD  Thank you for choosing [facility]!!     Magnesium Flakes    Signed, Donato SchultzMark Blossie Raffel, MD  08/27/2020 5:15 PM    [facility]

## 2020-08-27 NOTE — Patient Instructions (Addendum)
Medication Instructions:  The current medical regimen is effective;  continue present plan and medications.  You make take an over the counter Magensium supplement for muscle cramps.  *If you need a refill on your cardiac medications before your next appointment, please call your pharmacy*  Please limit fluid intake to 1,500 ml a day.  (1 and 1/2 liters)  Follow-Up: At [facility], you and your health needs are our priority.  As part of our continuing mission to provide you with exceptional heart care, we have created designated Provider Care Teams.  These Care Teams include your primary Cardiologist (physician) and Advanced Practice Providers (APPs -  Physician Assistants and Nurse Practitioners) who all work together to provide you with the care you need, when you need it.  We recommend signing up for the patient portal called "MyChart".  Sign up information is provided on this After Visit Summary.  MyChart is used to connect with patients for Virtual Visits (Telemedicine).  Patients are able to view lab/test results, encounter notes, upcoming appointments, etc.  Non-urgent messages can be sent to your provider as well.   To learn more about what you can do with MyChart, go to [email or web address].    Your next appointment:   6 month(s)  The format for your next appointment:   In Person  Provider:   Donato Schultz, MD  Thank you for choosing [facility]!!     Magnesium Flakes

## 2020-09-01 ENCOUNTER — Ambulatory Visit: Payer: Medicare Other | Admitting: Internal Medicine

## 2020-10-07 ENCOUNTER — Other Ambulatory Visit: Payer: Self-pay

## 2020-10-07 ENCOUNTER — Ambulatory Visit (INDEPENDENT_AMBULATORY_CARE_PROVIDER_SITE_OTHER): Payer: Medicare Other | Admitting: Allergy

## 2020-10-07 ENCOUNTER — Encounter: Payer: Self-pay | Admitting: Allergy

## 2020-10-07 VITALS — BP 120/78 | HR 91 | Temp 96.5°F | Resp 18 | Ht 66.0 in | Wt 337.8 lb

## 2020-10-07 DIAGNOSIS — J3089 Other allergic rhinitis: Secondary | ICD-10-CM

## 2020-10-07 DIAGNOSIS — T781XXD Other adverse food reactions, not elsewhere classified, subsequent encounter: Secondary | ICD-10-CM | POA: Diagnosis not present

## 2020-10-07 MED ORDER — EPINEPHRINE 0.3 MG/0.3ML IJ SOAJ: 0.3000 mg | INTRAMUSCULAR | 2 refills | Status: AC | PRN

## 2020-10-07 NOTE — Progress Notes (Unsigned)
New Patient Note  RE: Joanna Cuevas MRN: [id number] DOB: [birth year] Date of Office Visit: 10/07/2020  Referring provider: [name], * Primary care provider: [name], MD  Chief Complaint: Allergic Reaction (Shellfish allergy, will start swelling, having shortness of breath and puffy eyes just from being in the same room with it. It is a major issue when she goes on cruises for vacation //) and Allergy Testing (Shellfish /Is off of all antihistamines )  History of Present Illness: I had the pleasure of seeing Joanna Cuevas for initial evaluation at the [facility] on 10/08/2020. She is a 55 y.o. female, who is referred here by [name], MD for the evaluation of shellfish allergy.  She reports food allergy to shellfish. The reaction occurred in her teens, after she ate small amount of shrimp. Symptoms started within minutes and was in the form of facial swelling, throat itching, trouble breathing. Ambulance was called and was taken to the ER. She was treated with benadryl and hospitalized for 3 days. This was the first time she had shrimp. She is not sure what other medications she was given at that time.   In her 54s, patient had a second reaction at a seafood restaurant on Valentine's day. She ate her fried catfish first without any issues but after taking a bit of the shrimp, she noted mouth swelling, face swelling. She went to the restroom and had en episode of emesis at the restaurant. She took benadryl and symptoms resolved after a few days. She saw her PCP the following day and was prescribed an epinephrine at that time.   About 5-6 years ago patient was on a cruise. She was at dinner and someone at her table ordered shrimp. She is concerned about possible cross-contamination. She did not feel good at dinner and hardly ate her dinner. She had some throat pruritis, eye itching, eye swelling. She went to see the cruise doctor and was given  some type of injection and pills. Symptoms resolved within a few days.   About 3 years ago patient was at [facility] and somebody at the table ordered shrimp and she had vomiting just from smelling it. She took benadryl with good benefit.   In November 2021 patient went on a cruise and had issues with vomiting just by being around shrimp.   She does not have access to epinephrine autoinjector and not needed to use it.   Past work up includes: none. Dietary History: patient has been eating other foods including milk, eggs, peanut, treenuts, sesame, fish - catfish, wheat, meats, fruits and vegetables. No prior sesame ingestion. Not sure about soy production.   Pecans cause vomiting. Peanuts sometimes give her diarrhea.   She reports reading labels and avoiding shellfish and mollusks in diet completely.   Assessment and Plan: Joanna Cuevas is a 55 y.o. female with: Adverse reaction to food, subsequent encounter Multiple episodes of reactions to shrimp in the form of facial swelling, throat itching, emesis, trouble breathing. First episode required hospitalization. Other episodes treated at home with benadryl. Symptoms now occur by smelling shrimp without ingestion. No prior work up. Tolerates catfish. Avoiding mollusks as well. Pecan cause emesis, peanuts cause diarrhea.  Today's skin testing was positive to cockroaches. Borderline to mold and shellfish. Negative to tree nuts, peanuts, mollusks.   Continue strict avoidance of shellfish, mollusks. Avoid pecans.  Okay to eat catfish as before.  The patients history suggests shrimp allergy, though todays skin tests  were negative despite a positive histamine control.  Food allergen skin testing has excellent negative predictive value however there is still a small chance that the allergy exists. Therefore, we will investigate further with serum specific IgE levels and, if negative then schedule for open graded oral food challenge. A laboratory order  form has been provided for serum specific IgE against shellfish, nut panel.  I have prescribed epinephrine injectable and demonstrated proper use. For mild symptoms you can take over the counter antihistamines such as Benadryl and monitor symptoms closely. If symptoms worsen or if you have severe symptoms including breathing issues, throat closure, significant swelling, whole body hives, severe diarrhea and vomiting, lightheadedness then inject epinephrine and seek immediate medical care afterwards.  Food action plan given.  Other allergic rhinitis Rhinoconjunctivitis symptoms in the spring and takes over-the-counter antihistamines with good benefit.  Today's skin testing was positive to cockroaches. Borderline to mold and shellfish.  Will get bloodwork to check for pollen allergy as above allergens do not explain her spring time symptoms.   Start environmental control measures.   May use over the counter antihistamines such as Zyrtec (cetirizine), Claritin (loratadine), Allegra (fexofenadine), or Xyzal (levocetirizine) daily as needed.  Return in about 6 months (around 04/06/2021).  Meds ordered this encounter  Medications  . EPINEPHrine 0.3 mg/0.3 mL IJ SOAJ injection    Sig: Inject 0.3 mg into the muscle as needed for anaphylaxis.    Dispense:  1 each    Refill:  2    Okay to dispense generic/teva/mylan brand.    Lab Orders     IgE Nut Prof. w/Component Rflx     Allergen Profile, Shellfish     Allergens w/Total IgE Area 2  Other allergy screening: Asthma: no Rhino conjunctivitis: yes  Patient has some rhino conjunctivitis symptoms in the spring and takes OTC medications with good benefit.   Medication allergy: no Hymenoptera allergy: large localized reactions.  Urticaria: no Eczema:no History of recurrent infections suggestive of immunodeficency: no  Diagnostics: Skin Testing: Environmental allergy panel and select foods. Positive test to: cockroaches. Borderline to  mold and shellfish. Results discussed with patient/family.  Airborne Adult Perc - 10/07/20 1438    Time Antigen Placed 1438    Allergen Manufacturer Waynette ButteryGreer    Location Back    Number of Test 59    Panel 1 Select    1. Control-Buffer 50% Glycerol Negative    2. Control-Histamine 1 mg/ml 2+    3. Albumin saline Negative    4. Bahia Negative    5. French Southern TerritoriesBermuda Negative    6. Johnson Negative    7. [place] Blue Negative    8. Meadow Fescue Negative    9. Perennial Rye Negative    10. Sweet Vernal Negative    11. Timothy Negative    12. Cocklebur Negative    13. Burweed Marshelder Negative    14. Ragweed, short Negative    15. Ragweed, Giant Negative    16. Plantain,  English Negative    17. Lamb's Quarters Negative    18. Sheep Sorrell Negative    19. Rough Pigweed Negative    20. Marsh Elder, Rough Negative    21. Mugwort, Common Negative    22. Ash mix Negative    23. Birch mix Negative    24. Beech American Negative    25. Box, Elder Negative    26. Cedar, red Negative    27. Cottonwood, Guinea-BissauEastern Negative    28. Elm mix Negative  29. Hickory Negative    30. Maple mix Negative    31. Oak, Guinea-Bissau mix Negative    32. Pecan Pollen Negative    33. Pine mix Negative    34. Sycamore Eastern Negative    35. Walnut, Black Pollen Negative    36. Alternaria alternata Negative    37. Cladosporium Herbarum Negative    38. Aspergillus mix Negative    39. Penicillium mix Negative    40. Bipolaris sorokiniana (Helminthosporium) Negative    41. Drechslera spicifera (Curvularia) Negative    42. Mucor plumbeus Negative    43. Fusarium moniliforme Negative    44. Aureobasidium pullulans (pullulara) Negative    45. Rhizopus oryzae --   +/-   46. Botrytis cinera Negative    47. Epicoccum nigrum Negative    48. Phoma betae Negative    49. Candida Albicans Negative    50. Trichophyton mentagrophytes Negative    51. Mite, D Farinae  5,000 AU/ml Negative    52. Mite, D Pteronyssinus   5,000 AU/ml Negative    53. Cat Hair 10,000 BAU/ml Negative    54.  Dog Epithelia Negative    55. Mixed Feathers Negative    56. Horse Epithelia Negative    57. Cockroach, German 2+    58. Mouse Negative    59. Tobacco Leaf Negative          Food Adult Perc - 10/07/20 1400    Time Antigen Placed 1439 (P)     Allergen Manufacturer Greer (P)     Location Back (P)     Number of allergen test 26 (P)     Control-Histamine 1 mg/ml 2+    2. Soybean Negative    4. Sesame Negative    8. Shellfish Mix --   2x2   9. Fish Mix Negative    10. Cashew Negative    11. Pecan Food Negative    12. Walnut Food Negative    13. Almond Negative    14. Hazelnut --   +/-   15. [place] nut Negative    16. Coconut Negative    17. Pistachio Negative    18. Catfish Negative    19. Bass Negative    20. Trout Negative    21. Tuna Negative    22. Salmon Negative    23. Flounder Negative    24. Codfish Negative    25. Shrimp Negative    26. Crab Negative    27. Lobster Negative    28. Oyster Negative    29. Scallops Negative           Past Medical History: Patient Active Problem List   Diagnosis Date Noted  . Adverse reaction to food, subsequent encounter 10/08/2020  . Other allergic rhinitis 10/08/2020   Past Medical History:  Diagnosis Date  . ADHD   . Anemia   . Angio-edema   . Arthritis   . Chronic pain syndrome   . Diabetes mellitus without complication (HCC)    Type II  . Diastolic CHF (HCC)   . Disordered sleep   . DOE (dyspnea on exertion)   . Genital herpes simplex   . GERD (gastroesophageal reflux disease)   . HSV (herpes simplex virus) anogenital infection   . Hypercholesterolemia   . Hypertension   . Insomnia   . Kidney stones   . Morbid obesity (HCC)   . OSA on CPAP    cpap - does not know settings   . Peripheral  neuropathy   . Shellfish allergy    Past Surgical History: Past Surgical History:  Procedure Laterality Date  . ABDOMINAL HYSTERECTOMY    .  ADENOIDECTOMY    . ANAL FISSURECTOMY    . Bladder Tuck    . BREAST LUMPECTOMY  1992   benign - removed fatty tissue under both arms   . CESAREAN SECTION    . CHOLECYSTECTOMY    . CYSTOSCOPY WITH RETROGRADE PYELOGRAM, URETEROSCOPY AND STENT PLACEMENT Bilateral 06/11/2020   Procedure: CYSTOSCOPY WITH BILATERAL RETROGRADE PYELOGRAM, LEFT URETEROSCOPY LASER LITHOTRIPSY AND STENT PLACEMENT, LEFT ENDOPYELOTOMY;  Surgeon: Crist Fat, MD;  Location: [facility];  Service: Urology;  Laterality: Bilateral;  . ENDOMETRIAL ABLATION    . ENDOVENOUS ABLATION SAPHENOUS VEIN W/ LASER    . HERNIA REPAIR    . KIDNEY STONE SURGERY    . TONSILLECTOMY    . TOTAL VAGINAL HYSTERECTOMY    . TUBAL LIGATION     Medication List:  Current Outpatient Medications  Medication Sig Dispense Refill  . AMITIZA 24 MCG capsule Take 24 mcg by mouth daily.     Marland Kitchen amphetamine-dextroamphetamine (ADDERALL XR) 30 MG 24 hr capsule Take 30 mg by mouth daily.     . bumetanide (BUMEX) 1 MG tablet Take 2 mg by mouth every Monday, Wednesday, and Friday. In the morning.    . cholecalciferol (VITAMIN D) 25 MCG (1000 UNIT) tablet Take 1,000 Units by mouth daily.    Marland Kitchen ELDERBERRY PO Take 200 mg by mouth daily. 100 mg/per tablet    . EPINEPHrine 0.3 mg/0.3 mL IJ SOAJ injection Inject 0.3 mg into the muscle as needed for anaphylaxis. 1 each 2  . ferrous sulfate 325 (65 FE) MG tablet Take 325 mg by mouth daily.    . fluticasone (FLONASE) 50 MCG/ACT nasal spray Place 1-2 sprays into both nostrils 3 (three) times daily as needed for allergies.    Marland Kitchen gabapentin (NEURONTIN) 300 MG capsule Take 600-1,200 mg by mouth See admin instructions. Take 2 capsules (600 mg) by mouth in the morning & take 4 capsules (1200 mg) by mouth at bedtime.    Marland Kitchen HYDROcodone-acetaminophen (NORCO) 7.5-325 MG tablet Take 1-2 tablets by mouth 4 (four) times daily as needed (pain.). 15 tablet 0  . Lancets (ONETOUCH DELICA PLUS LANCET30G) MISC USE AS DIRECTED TO CHECK BLOOD  SUGAR    . lisinopril-hydrochlorothiazide (ZESTORETIC) 20-25 MG tablet Take 2 tablets by mouth daily. In the morning    . metFORMIN (GLUCOPHAGE) 1000 MG tablet Take 1,000 mg by mouth in the morning and at bedtime.     . metoCLOPramide (REGLAN) 5 MG tablet Take 5 mg by mouth 2 (two) times daily as needed for nausea.     Letta Pate VERIO test strip USE STRIP TO CHECK GLUCOSE TWICE DAILY    . pantoprazole (PROTONIX) 40 MG tablet Take 40 mg by mouth daily as needed (acid reflux/indigestion.).     Marland Kitchen potassium chloride (KLOR-CON) 10 MEQ tablet Take 10 mEq by mouth See admin instructions. Take 1 tablet (10 meq) by mouth twice daily every other day    . PREMARIN 0.625 MG tablet Take 0.625 mg by mouth at bedtime.     . RYBELSUS 7 MG TABS Take 7 mg by mouth daily.    . simvastatin (ZOCOR) 40 MG tablet Take 40 mg by mouth at bedtime.     . valACYclovir (VALTREX) 500 MG tablet Take 500 mg by mouth daily.     . vitamin B-12 (CYANOCOBALAMIN)  500 MCG tablet Take 500 mcg by mouth daily.    . Zinc 25 MG TABS 1 tablet    . phenazopyridine (PYRIDIUM) 200 MG tablet Take 1 tablet (200 mg total) by mouth 3 (three) times daily as needed for pain. 10 tablet 0   No current facility-administered medications for this visit.   Allergies: Allergies  Allergen Reactions  . Shellfish-Derived Products Anaphylaxis  . Shellfish Allergy Other (See Comments)    Throat burning   Social History: Social History   Socioeconomic History  . Marital status: Married    Spouse name: Not on file  . Number of children: 2  . Years of education: 105  . Highest education level: Not on file  Occupational History  . Not on file  Tobacco Use  . Smoking status: Former Games developer  . Smokeless tobacco: Never Used  Vaping Use  . Vaping Use: Never used  Substance and Sexual Activity  . Alcohol use: Yes    Comment: occ   . Drug use: Never  . Sexual activity: Yes  Other Topics Concern  . Not on file  Social History Narrative  . Not  on file   Social Determinants of Health   Financial Resource Strain: Not on file  Food Insecurity: Not on file  Transportation Needs: Not on file  Physical Activity: Not on file  Stress: Not on file  Social Connections: Not on file   Lives in an apartment in [place] and in a house in [place] (2 months out of the year) Smoking: denies Occupation: on disability  Environmental History: Water Damage/mildew in the house: no Carpet in the family room: yes Carpet in the bedroom: yes Heating: electric Cooling: central Pet: no  Family History: Family History  Problem Relation Age of Onset  . Breast cancer Maternal Aunt    Problem                               Relation Asthma                                   No  Eczema                                No  Food allergy                          No  Allergic rhino conjunctivitis     No  Angioedema    No  Review of Systems  Constitutional: Positive for chills. Negative for appetite change, fever and unexpected weight change.  HENT: Negative for congestion and rhinorrhea.   Eyes: Negative for itching.  Respiratory: Negative for cough, chest tightness, shortness of breath and wheezing.   Cardiovascular: Negative for chest pain.  Gastrointestinal: Negative for abdominal pain.  Genitourinary: Negative for difficulty urinating.  Skin: Negative for rash.  Allergic/Immunologic: Positive for environmental allergies and food allergies.   Objective: BP 120/78   Pulse 91   Temp (!) 96.5 F (35.8 C)   Resp 18   Ht 5\' 6"  (1.676 m)   Wt (!) 337 lb 12.8 oz (153.2 kg)   SpO2 97%   BMI 54.52 kg/m  Body mass index is 54.52 kg/m. Physical Exam Vitals and nursing note reviewed.  Constitutional:  Appearance: Normal appearance. She is well-developed.  HENT:     Head: Normocephalic and atraumatic.     Right Ear: External ear normal.     Left Ear: External ear normal.     Nose: Nose normal.     Mouth/Throat:     Mouth: Mucous membranes  are moist.     Pharynx: Oropharynx is clear.  Eyes:     Conjunctiva/sclera: Conjunctivae normal.  Cardiovascular:     Rate and Rhythm: Normal rate and regular rhythm.     Heart sounds: Normal heart sounds. No murmur heard. No friction rub. No gallop.   Pulmonary:     Effort: Pulmonary effort is normal.     Breath sounds: Normal breath sounds. No wheezing, rhonchi or rales.  Abdominal:     Palpations: Abdomen is soft.  Musculoskeletal:     Cervical back: Neck supple.  Skin:    General: Skin is warm.     Findings: No rash.  Neurological:     Mental Status: She is alert and oriented to person, place, and time.  Psychiatric:        Behavior: Behavior normal.    The plan was reviewed with the patient/family, and all questions/concerned were addressed.  It was my pleasure to see Pattricia Bossnnie today and participate in her care. Please feel free to contact me with any questions or concerns.  Sincerely,  Wyline MoodYoon Kim, DO Allergy & Immunology  [facility] [place] office: [phone] [place] office: [phone]

## 2020-10-07 NOTE — Patient Instructions (Addendum)
Today's skin testing was positive to cockroaches. Borderline to mold and shellfish.  Food allergy:  Continue strict avoidance of shellfish, mollusks.  Avoid pecans.  Okay to eat catfish as before.   I have prescribed epinephrine injectable and demonstrated proper use. For mild symptoms you can take over the counter antihistamines such as Benadryl and monitor symptoms closely. If symptoms worsen or if you have severe symptoms including breathing issues, throat closure, significant swelling, whole body hives, severe diarrhea and vomiting, lightheadedness then inject epinephrine and seek immediate medical care afterwards.  Food action plan given. Get bloodwork:  We are ordering labs, so please allow 1-2 weeks for the results to come back. With the newly implemented Cures Act, the labs might be visible to you at the same time that they become visible to me. However, I will not address the results until all of the results are back, so please be patient.  In the meantime, continue recommendations in your patient instructions, including avoidance measures (if applicable), until you hear from me.  Environmental allergies:  Start environmental control measures.  May use over the counter antihistamines such as Zyrtec (cetirizine), Claritin (loratadine), Allegra (fexofenadine), or Xyzal (levocetirizine) daily as needed.  Follow up in 6 months or sooner if needed.   Cockroach Allergen Avoidance Cockroaches are often found in the homes of densely populated urban areas, schools or commercial buildings, but these creatures can lurk almost anywhere. This does not mean that you have a dirty house or living area. . Block all areas where roaches can enter the home. This includes crevices, wall cracks and windows.  . Cockroaches need water to survive, so fix and seal all leaky faucets and pipes. Have an exterminator go through the house when your family and pets are gone to eliminate any remaining  roaches. Marland Kitchen Keep food in lidded containers and put pet food dishes away after your pets are done eating. Vacuum and sweep the floor after meals, and take out garbage and recyclables. Use lidded garbage containers in the kitchen. Wash dishes immediately after use and clean under stoves, refrigerators or toasters where crumbs can accumulate. Wipe off the stove and other kitchen surfaces and cupboards regularly.  Reducing Pollen Exposure . Pollen seasons: trees (spring), grass (summer) and ragweed/weeds (fall). Marland Kitchen Keep windows closed in your home and car to lower pollen exposure.  Lilian Kapur air conditioning in the bedroom and throughout the house if possible.  . Avoid going out in dry windy days - especially early morning. . Pollen counts are highest between 5 - 10 AM and on dry, hot and windy days.  . Save outside activities for late afternoon or after a heavy rain, when pollen levels are lower.  . Avoid mowing of grass if you have grass pollen allergy. Marland Kitchen Be aware that pollen can also be transported indoors on people and pets.  . Dry your clothes in an automatic dryer rather than hanging them outside where they might collect pollen.  . Rinse hair and eyes before bedtime.

## 2020-10-08 ENCOUNTER — Encounter: Payer: Self-pay | Admitting: Allergy

## 2020-10-08 DIAGNOSIS — T781XXD Other adverse food reactions, not elsewhere classified, subsequent encounter: Secondary | ICD-10-CM | POA: Insufficient documentation

## 2020-10-08 DIAGNOSIS — J3089 Other allergic rhinitis: Secondary | ICD-10-CM | POA: Insufficient documentation

## 2020-10-08 NOTE — Assessment & Plan Note (Signed)
Multiple episodes of reactions to shrimp in the form of facial swelling, throat itching, emesis, trouble breathing. First episode required hospitalization. Other episodes treated at home with benadryl. Symptoms now occur by smelling shrimp without ingestion. No prior work up. Tolerates catfish. Avoiding mollusks as well. Pecan cause emesis, peanuts cause diarrhea.  Today's skin testing was positive to cockroaches. Borderline to mold and shellfish. Negative to tree nuts, peanuts, mollusks.   Continue strict avoidance of shellfish, mollusks. Avoid pecans.  Okay to eat catfish as before.  The patients history suggests shrimp allergy, though todays skin tests were negative despite a positive histamine control.  Food allergen skin testing has excellent negative predictive value however there is still a small chance that the allergy exists. Therefore, we will investigate further with serum specific IgE levels and, if negative then schedule for open graded oral food challenge. A laboratory order form has been provided for serum specific IgE against shellfish, nut panel.  I have prescribed epinephrine injectable and demonstrated proper use. For mild symptoms you can take over the counter antihistamines such as Benadryl and monitor symptoms closely. If symptoms worsen or if you have severe symptoms including breathing issues, throat closure, significant swelling, whole body hives, severe diarrhea and vomiting, lightheadedness then inject epinephrine and seek immediate medical care afterwards.  Food action plan given.

## 2020-10-08 NOTE — Assessment & Plan Note (Signed)
Rhinoconjunctivitis symptoms in the spring and takes over-the-counter antihistamines with good benefit.  Today's skin testing was positive to cockroaches. Borderline to mold and shellfish.  Will get bloodwork to check for pollen allergy as above allergens do not explain her spring time symptoms.   Start environmental control measures.   May use over the counter antihistamines such as Zyrtec (cetirizine), Claritin (loratadine), Allegra (fexofenadine), or Xyzal (levocetirizine) daily as needed.

## 2020-10-12 LAB — PANEL 604726
Cor A 1 IgE: <0.1 kU/L
Cor A 14 IgE: <0.1 kU/L
Cor A 8 IgE: <0.1 kU/L
Cor A 9 IgE: <0.1 kU/L

## 2020-10-12 LAB — ALLERGENS W/TOTAL IGE AREA 2
Alternaria Alternata IgE: <0.1 kU/L
Aspergillus Fumigatus IgE: <0.1 kU/L
Bermuda Grass IgE: <0.1 kU/L
Cat Dander IgE: <0.1 kU/L
Cedar, Mountain IgE: <0.1 kU/L
Cladosporium Herbarum IgE: <0.1 kU/L
Cockroach, German IgE: 1.28 kU/L — AB
Common Silver Birch IgE: <0.1 kU/L
Cottonwood IgE: <0.1 kU/L
D Farinae IgE: <0.1 kU/L
D Pteronyssinus IgE: 0.25 kU/L — AB
Dog Dander IgE: <0.1 kU/L
Elm, American IgE: <0.1 kU/L
IgE (Immunoglobulin E), Serum: 69 IU/mL (ref 6–495)
Johnson Grass IgE: <0.1 kU/L
Maple/Box Elder IgE: <0.1 kU/L
Mouse Urine IgE: <0.1 kU/L
Oak, White IgE: <0.1 kU/L
Pecan, Hickory IgE: <0.1 kU/L
Penicillium Chrysogen IgE: <0.1 kU/L
Pigweed, Rough IgE: <0.1 kU/L
Ragweed, Short IgE: <0.1 kU/L
Sheep Sorrel IgE Qn: <0.1 kU/L
Timothy Grass IgE: <0.1 kU/L
White Mulberry IgE: <0.1 kU/L

## 2020-10-12 LAB — ALLERGEN PROFILE, SHELLFISH
Clam IgE: <0.1 kU/L
F023-IgE Crab: <0.1 kU/L
F080-IgE Lobster: <0.1 kU/L
F290-IgE Oyster: <0.1 kU/L
Scallop IgE: <0.1 kU/L
Shrimp IgE: 1.22 kU/L — AB

## 2020-10-12 LAB — IGE NUT PROF. W/COMPONENT RFLX
F017-IgE Hazelnut (Filbert): 0.26 kU/L — AB
F018-IgE Brazil Nut: <0.1 kU/L
F020-IgE Almond: <0.1 kU/L
F202-IgE Cashew Nut: <0.1 kU/L
F203-IgE Pistachio Nut: <0.1 kU/L
F256-IgE Walnut: 0.26 kU/L — AB
Macadamia Nut, IgE: 0.13 kU/L — AB
Peanut, IgE: 0.15 kU/L — AB
Pecan Nut IgE: <0.1 kU/L

## 2020-10-12 LAB — PANEL 604721
Jug R 1 IgE: <0.1 kU/L
Jug R 3 IgE: <0.1 kU/L

## 2020-10-12 LAB — PEANUT COMPONENTS
F352-IgE Ara h 8: <0.1 kU/L
F422-IgE Ara h 1: <0.1 kU/L
F423-IgE Ara h 2: <0.1 kU/L
F424-IgE Ara h 3: <0.1 kU/L
F427-IgE Ara h 9: <0.1 kU/L
F447-IgE Ara h 6: <0.1 kU/L

## 2020-10-12 LAB — ALLERGEN COMPONENT COMMENTS

## 2020-10-23 DIAGNOSIS — M25571 Pain in right ankle and joints of right foot: Secondary | ICD-10-CM | POA: Diagnosis not present

## 2020-10-23 DIAGNOSIS — G629 Polyneuropathy, unspecified: Secondary | ICD-10-CM | POA: Diagnosis not present

## 2020-10-23 DIAGNOSIS — M79671 Pain in right foot: Secondary | ICD-10-CM | POA: Diagnosis not present

## 2020-10-23 DIAGNOSIS — G894 Chronic pain syndrome: Secondary | ICD-10-CM | POA: Diagnosis not present

## 2020-10-27 ENCOUNTER — Other Ambulatory Visit: Payer: Self-pay | Admitting: Obstetrics and Gynecology

## 2020-10-27 DIAGNOSIS — Z1231 Encounter for screening mammogram for malignant neoplasm of breast: Secondary | ICD-10-CM

## 2020-10-29 DIAGNOSIS — H16222 Keratoconjunctivitis sicca, not specified as Sjogren's, left eye: Secondary | ICD-10-CM | POA: Diagnosis not present

## 2020-10-29 DIAGNOSIS — H11152 Pinguecula, left eye: Secondary | ICD-10-CM | POA: Diagnosis not present

## 2020-11-02 ENCOUNTER — Other Ambulatory Visit: Payer: Self-pay | Admitting: Gastroenterology

## 2020-11-02 DIAGNOSIS — K219 Gastro-esophageal reflux disease without esophagitis: Secondary | ICD-10-CM | POA: Diagnosis not present

## 2020-11-02 DIAGNOSIS — D500 Iron deficiency anemia secondary to blood loss (chronic): Secondary | ICD-10-CM | POA: Diagnosis not present

## 2020-11-09 DIAGNOSIS — R197 Diarrhea, unspecified: Secondary | ICD-10-CM | POA: Diagnosis not present

## 2020-11-09 DIAGNOSIS — I10 Essential (primary) hypertension: Secondary | ICD-10-CM | POA: Diagnosis not present

## 2020-11-09 DIAGNOSIS — E7800 Pure hypercholesterolemia, unspecified: Secondary | ICD-10-CM | POA: Diagnosis not present

## 2020-11-09 DIAGNOSIS — E1142 Type 2 diabetes mellitus with diabetic polyneuropathy: Secondary | ICD-10-CM | POA: Diagnosis not present

## 2020-11-09 DIAGNOSIS — Z7984 Long term (current) use of oral hypoglycemic drugs: Secondary | ICD-10-CM | POA: Diagnosis not present

## 2020-11-25 DIAGNOSIS — H11153 Pinguecula, bilateral: Secondary | ICD-10-CM | POA: Diagnosis not present

## 2020-11-25 DIAGNOSIS — R059 Cough, unspecified: Secondary | ICD-10-CM | POA: Diagnosis not present

## 2020-11-25 DIAGNOSIS — H2511 Age-related nuclear cataract, right eye: Secondary | ICD-10-CM | POA: Diagnosis not present

## 2020-11-25 DIAGNOSIS — H15102 Unspecified episcleritis, left eye: Secondary | ICD-10-CM | POA: Diagnosis not present

## 2020-11-25 DIAGNOSIS — H109 Unspecified conjunctivitis: Secondary | ICD-10-CM | POA: Diagnosis not present

## 2020-11-25 DIAGNOSIS — J019 Acute sinusitis, unspecified: Secondary | ICD-10-CM | POA: Diagnosis not present

## 2020-11-25 DIAGNOSIS — H11002 Unspecified pterygium of left eye: Secondary | ICD-10-CM | POA: Diagnosis not present

## 2020-11-30 DIAGNOSIS — Z79891 Long term (current) use of opiate analgesic: Secondary | ICD-10-CM | POA: Diagnosis not present

## 2020-11-30 DIAGNOSIS — M79671 Pain in right foot: Secondary | ICD-10-CM | POA: Diagnosis not present

## 2020-11-30 DIAGNOSIS — G894 Chronic pain syndrome: Secondary | ICD-10-CM | POA: Diagnosis not present

## 2020-11-30 DIAGNOSIS — G629 Polyneuropathy, unspecified: Secondary | ICD-10-CM | POA: Diagnosis not present

## 2020-11-30 DIAGNOSIS — Z79899 Other long term (current) drug therapy: Secondary | ICD-10-CM | POA: Diagnosis not present

## 2020-11-30 DIAGNOSIS — M25571 Pain in right ankle and joints of right foot: Secondary | ICD-10-CM | POA: Diagnosis not present

## 2020-12-03 DIAGNOSIS — E113291 Type 2 diabetes mellitus with mild nonproliferative diabetic retinopathy without macular edema, right eye: Secondary | ICD-10-CM | POA: Diagnosis not present

## 2020-12-03 DIAGNOSIS — H1045 Other chronic allergic conjunctivitis: Secondary | ICD-10-CM | POA: Diagnosis not present

## 2020-12-03 DIAGNOSIS — H11153 Pinguecula, bilateral: Secondary | ICD-10-CM | POA: Diagnosis not present

## 2020-12-03 DIAGNOSIS — H16223 Keratoconjunctivitis sicca, not specified as Sjogren's, bilateral: Secondary | ICD-10-CM | POA: Diagnosis not present

## 2020-12-07 DIAGNOSIS — M65811 Other synovitis and tenosynovitis, right shoulder: Secondary | ICD-10-CM | POA: Diagnosis not present

## 2020-12-07 DIAGNOSIS — M25511 Pain in right shoulder: Secondary | ICD-10-CM | POA: Diagnosis not present

## 2020-12-07 DIAGNOSIS — M75101 Unspecified rotator cuff tear or rupture of right shoulder, not specified as traumatic: Secondary | ICD-10-CM | POA: Diagnosis not present

## 2020-12-14 ENCOUNTER — Other Ambulatory Visit: Payer: Self-pay

## 2020-12-14 ENCOUNTER — Encounter (HOSPITAL_COMMUNITY): Payer: Self-pay | Admitting: Gastroenterology

## 2020-12-15 ENCOUNTER — Ambulatory Visit
Admission: RE | Admit: 2020-12-15 | Discharge: 2020-12-15 | Disposition: A | Discharge status: Home / Self Care | Payer: Medicare Other | Source: Ambulatory Visit | Attending: Obstetrics and Gynecology | Admitting: Obstetrics and Gynecology

## 2020-12-15 ENCOUNTER — Other Ambulatory Visit (HOSPITAL_COMMUNITY)
Admission: RE | Admit: 2020-12-15 | Discharge: 2020-12-15 | Disposition: A | Discharge status: Home / Self Care | Payer: Medicare Other | Source: Ambulatory Visit | Attending: Gastroenterology | Admitting: Gastroenterology

## 2020-12-15 DIAGNOSIS — Z1231 Encounter for screening mammogram for malignant neoplasm of breast: Secondary | ICD-10-CM

## 2020-12-15 DIAGNOSIS — Z01812 Encounter for preprocedural laboratory examination: Secondary | ICD-10-CM | POA: Diagnosis not present

## 2020-12-15 DIAGNOSIS — Z20822 Contact with and (suspected) exposure to covid-19: Secondary | ICD-10-CM | POA: Insufficient documentation

## 2020-12-15 LAB — SARS CORONAVIRUS 2 (TAT 6-24 HRS): SARS Coronavirus 2: NEGATIVE

## 2020-12-16 DIAGNOSIS — G4733 Obstructive sleep apnea (adult) (pediatric): Secondary | ICD-10-CM | POA: Diagnosis not present

## 2020-12-17 NOTE — Anesthesia Preprocedure Evaluation (Addendum)
Anesthesia Evaluation  Patient identified by MRN, date of birth, ID band Patient awake    Reviewed: Allergy & Precautions, NPO status , Patient's Chart, lab work & pertinent test results  Airway Mallampati: II  TM Distance: >3 FB Neck ROM: Full    Dental no notable dental hx. (+) Teeth Intact, Dental Advisory Given   Pulmonary sleep apnea , former smoker,    Pulmonary exam normal breath sounds clear to auscultation       Cardiovascular hypertension, Normal cardiovascular exam Rhythm:Regular Rate:Normal     Neuro/Psych    GI/Hepatic GERD  ,  Endo/Other  diabetes  Renal/GU      Musculoskeletal  (+) Arthritis ,   Abdominal (+) + obese,   Peds  Hematology   Anesthesia Other Findings   Reproductive/Obstetrics                           Anesthesia Physical Anesthesia Plan  ASA: III  Anesthesia Plan: MAC   Post-op Pain Management:    Induction:   PONV Risk Score and Plan: Treatment may vary due to age or medical condition  Airway Management Planned: Natural Airway  Additional Equipment: None  Intra-op Plan:   Post-operative Plan:   Informed Consent: I have reviewed the patients History and Physical, chart, labs and discussed the procedure including the risks, benefits and alternatives for the proposed anesthesia with the patient or authorized representative who has indicated his/her understanding and acceptance.     Dental advisory given  Plan Discussed with: CRNA and Anesthesiologist  Anesthesia Plan Comments: (EGD Fe deficicy anemia)       Anesthesia Quick Evaluation

## 2020-12-18 ENCOUNTER — Encounter (HOSPITAL_COMMUNITY): Payer: Self-pay | Admitting: Gastroenterology

## 2020-12-18 ENCOUNTER — Ambulatory Visit (HOSPITAL_COMMUNITY): Payer: Medicare Other | Admitting: Anesthesiology

## 2020-12-18 ENCOUNTER — Other Ambulatory Visit: Payer: Self-pay

## 2020-12-18 ENCOUNTER — Ambulatory Visit (HOSPITAL_COMMUNITY)
Admission: RE | Admit: 2020-12-18 | Discharge: 2020-12-18 | Disposition: A | Discharge status: Home / Self Care | Payer: Medicare Other | Attending: Gastroenterology | Admitting: Gastroenterology

## 2020-12-18 ENCOUNTER — Encounter (HOSPITAL_COMMUNITY)
Admission: RE | Disposition: A | Discharge status: Home / Self Care | Payer: Self-pay | Source: Home / Self Care | Attending: Gastroenterology

## 2020-12-18 DIAGNOSIS — K3189 Other diseases of stomach and duodenum: Secondary | ICD-10-CM | POA: Diagnosis not present

## 2020-12-18 DIAGNOSIS — Z87891 Personal history of nicotine dependence: Secondary | ICD-10-CM | POA: Insufficient documentation

## 2020-12-18 DIAGNOSIS — K2950 Unspecified chronic gastritis without bleeding: Secondary | ICD-10-CM | POA: Diagnosis not present

## 2020-12-18 DIAGNOSIS — K2970 Gastritis, unspecified, without bleeding: Secondary | ICD-10-CM | POA: Diagnosis not present

## 2020-12-18 DIAGNOSIS — K648 Other hemorrhoids: Secondary | ICD-10-CM | POA: Diagnosis not present

## 2020-12-18 DIAGNOSIS — K621 Rectal polyp: Secondary | ICD-10-CM | POA: Insufficient documentation

## 2020-12-18 DIAGNOSIS — Z6841 Body Mass Index (BMI) 40.0 and over, adult: Secondary | ICD-10-CM | POA: Insufficient documentation

## 2020-12-18 DIAGNOSIS — K5140 Inflammatory polyps of colon without complications: Secondary | ICD-10-CM | POA: Insufficient documentation

## 2020-12-18 DIAGNOSIS — K5730 Diverticulosis of large intestine without perforation or abscess without bleeding: Secondary | ICD-10-CM | POA: Diagnosis not present

## 2020-12-18 DIAGNOSIS — K635 Polyp of colon: Secondary | ICD-10-CM | POA: Diagnosis not present

## 2020-12-18 DIAGNOSIS — Q439 Congenital malformation of intestine, unspecified: Secondary | ICD-10-CM | POA: Insufficient documentation

## 2020-12-18 DIAGNOSIS — R12 Heartburn: Secondary | ICD-10-CM | POA: Insufficient documentation

## 2020-12-18 DIAGNOSIS — Z7984 Long term (current) use of oral hypoglycemic drugs: Secondary | ICD-10-CM | POA: Insufficient documentation

## 2020-12-18 DIAGNOSIS — K319 Disease of stomach and duodenum, unspecified: Secondary | ICD-10-CM | POA: Diagnosis not present

## 2020-12-18 DIAGNOSIS — D509 Iron deficiency anemia, unspecified: Secondary | ICD-10-CM | POA: Diagnosis not present

## 2020-12-18 DIAGNOSIS — Z79899 Other long term (current) drug therapy: Secondary | ICD-10-CM | POA: Diagnosis not present

## 2020-12-18 HISTORY — PX: POLYPECTOMY: SHX5525

## 2020-12-18 HISTORY — PX: ESOPHAGOGASTRODUODENOSCOPY (EGD) WITH PROPOFOL: SHX5813

## 2020-12-18 HISTORY — PX: BIOPSY: SHX5522

## 2020-12-18 HISTORY — PX: COLONOSCOPY WITH PROPOFOL: SHX5780

## 2020-12-18 LAB — GLUCOSE, CAPILLARY: Glucose-Capillary: 88 mg/dL (ref 70–99)

## 2020-12-18 SURGERY — ESOPHAGOGASTRODUODENOSCOPY (EGD) WITH PROPOFOL
Anesthesia: Monitor Anesthesia Care

## 2020-12-18 MED ORDER — LACTATED RINGERS IV SOLN: INTRAVENOUS | Status: DC

## 2020-12-18 MED ORDER — PROPOFOL 500 MG/50ML IV EMUL
INTRAVENOUS | Status: AC
  Filled 2020-12-18: qty 50

## 2020-12-18 MED ORDER — PROPOFOL 1000 MG/100ML IV EMUL
INTRAVENOUS | Status: AC
  Filled 2020-12-18: qty 100

## 2020-12-18 MED ORDER — PROPOFOL 10 MG/ML IV BOLUS
INTRAVENOUS | Status: DC | PRN
  Administered 2020-12-18: 10 mg via INTRAVENOUS
  Administered 2020-12-18: 20 mg via INTRAVENOUS
  Administered 2020-12-18 (×8): 10 mg via INTRAVENOUS

## 2020-12-18 MED ORDER — PROPOFOL 500 MG/50ML IV EMUL: INTRAVENOUS | Status: DC | PRN

## 2020-12-18 MED ORDER — PROPOFOL 10 MG/ML IV BOLUS
INTRAVENOUS | Status: AC
  Filled 2020-12-18: qty 20

## 2020-12-18 MED ORDER — PROPOFOL 500 MG/50ML IV EMUL
INTRAVENOUS | Status: DC | PRN
  Administered 2020-12-18: 135 ug/kg/min via INTRAVENOUS

## 2020-12-18 SURGICAL SUPPLY — 24 items

## 2020-12-18 NOTE — Discharge Instructions (Signed)

## 2020-12-18 NOTE — Anesthesia Procedure Notes (Signed)
Procedure Name: MAC Date/Time: 12/18/2020 10:07 AM Performed by: Niel Hummer, CRNA Pre-anesthesia Checklist: Patient identified, Emergency Drugs available, Suction available and Patient being monitored Oxygen Delivery Method: Simple face mask

## 2020-12-18 NOTE — H&P (Signed)
Primary Care Physician:  [name], MD Primary Gastroenterologist:  [facility]   Reason for Consultation:  Anemia   HPI:  55 year old female with history of morbid obesity, BMI is above 50, hypertension, type 2 diabetes, chronic pain syndrome chronic chronic diastolic congestive heart failure follows with Dr. Anne Fu was seen in the office for evaluation of colonoscopy..         Patient did have an anemia at last visit 08/18/2020, HGB 11.0, HCT 33.4. She does not have menses, she is not on a blood thinner.         she denies seeing any blood in the stool or black stool.  Denies abdominal pain, nausea or vomiting.  Complaining of intermittent reflux .denies trouble swallowing or pain while swallowing.        Patient had last colonoscopy in [place].         05/08/2020 CT abdomen and pelvis without contrast status post cholecystectomy, pancreas normal, liver unremarkable, that showed bilateral intrarenal stones mild left hydronephrosis.    Past Medical History:  Diagnosis Date  . ADHD   . Anemia   . Angio-edema   . Arthritis   . Chronic pain syndrome   . Diabetes mellitus without complication (HCC)    Type II  . Diastolic CHF (HCC)   . Disordered sleep   . DOE (dyspnea on exertion)   . Genital herpes simplex   . GERD (gastroesophageal reflux disease)   . HSV (herpes simplex virus) anogenital infection   . Hypercholesterolemia   . Hypertension   . Insomnia   . Kidney stones   . Morbid obesity (HCC)   . OSA on CPAP    cpap - does not know settings   . Peripheral neuropathy   . Shellfish allergy     Past Surgical History:  Procedure Laterality Date  . ABDOMINAL HYSTERECTOMY    . ADENOIDECTOMY    . ANAL FISSURECTOMY    . Bladder Tuck    . BREAST LUMPECTOMY  1992   benign - removed fatty tissue under both arms   . CESAREAN SECTION    . CHOLECYSTECTOMY    . CYSTOSCOPY WITH RETROGRADE PYELOGRAM, URETEROSCOPY AND STENT PLACEMENT Bilateral 06/11/2020   Procedure:  CYSTOSCOPY WITH BILATERAL RETROGRADE PYELOGRAM, LEFT URETEROSCOPY LASER LITHOTRIPSY AND STENT PLACEMENT, LEFT ENDOPYELOTOMY;  Surgeon: Crist Fat, MD;  Location: [facility];  Service: Urology;  Laterality: Bilateral;  . ENDOMETRIAL ABLATION    . ENDOVENOUS ABLATION SAPHENOUS VEIN W/ LASER    . HERNIA REPAIR    . KIDNEY STONE SURGERY    . TONSILLECTOMY    . TOTAL VAGINAL HYSTERECTOMY    . TUBAL LIGATION      Prior to Admission medications   Medication Sig Start Date End Date Taking? Authorizing Provider  AMITIZA 24 MCG capsule Take 24 mcg by mouth daily.  04/06/19  Yes [provider]  amphetamine-dextroamphetamine (ADDERALL XR) 30 MG 24 hr capsule Take 30 mg by mouth daily.  05/10/19  Yes [provider]  bumetanide (BUMEX) 1 MG tablet Take 2 mg by mouth every Monday, Wednesday, and Friday. In the morning. 05/10/19  Yes [provider]  Carboxymethylcellulose Sodium 1 % GEL Place 1 drop into both eyes daily as needed (dry eyes).   Yes [provider]  cholecalciferol (VITAMIN D) 25 MCG (1000 UNIT) tablet Take 1,000 Units by mouth daily.   Yes [provider]  cycloSPORINE (RESTASIS) 0.05 % ophthalmic emulsion Place 1 drop into both eyes  2 (two) times daily.   Yes [provider]  ELDERBERRY PO Take 200 mg by mouth daily. 100 mg/per tablet   Yes [provider]  ferrous sulfate 325 (65 FE) MG tablet Take 325 mg by mouth daily.   Yes [provider]  fluticasone (FLONASE) 50 MCG/ACT nasal spray Place 1-2 sprays into both nostrils 3 (three) times daily as needed for allergies. 01/09/20  Yes [provider]  gabapentin (NEURONTIN) 300 MG capsule Take 600-1,200 mg by mouth See admin instructions. Take 2 capsules (600 mg) by mouth in the morning & take 4 capsules (1200 mg) by mouth at bedtime. 03/05/20  Yes [provider]  HYDROcodone-acetaminophen (NORCO) 7.5-325 MG tablet Take 1-2 tablets by mouth 4 (four)  times daily as needed (pain.). 06/11/20  Yes Crist Fat, MD  Lancets [facility] DELICA PLUS LANCET30G) MISC USE AS DIRECTED TO CHECK BLOOD SUGAR 03/07/19  Yes [provider]  metFORMIN (GLUCOPHAGE) 1000 MG tablet Take 1,000 mg by mouth in the morning and at bedtime.  04/13/19  Yes [provider]  metoCLOPramide (REGLAN) 5 MG tablet Take 5 mg by mouth 2 (two) times daily as needed for nausea.  06/04/19  Yes [provider]  ONETOUCH VERIO test strip USE STRIP TO CHECK GLUCOSE TWICE DAILY 03/07/19  Yes [provider]  pantoprazole (PROTONIX) 40 MG tablet Take 40 mg by mouth daily as needed (acid reflux/indigestion.).  05/10/19  Yes [provider]  potassium chloride (KLOR-CON) 10 MEQ tablet Take 10 mEq by mouth See admin instructions. Take 1 tablet (10 meq) by mouth twice daily every other day 04/03/20  Yes [provider]  PREMARIN 0.625 MG tablet Take 0.625 mg by mouth at bedtime.  05/08/19  Yes [provider]  Semaglutide,0.25 or 0.5MG /DOS, (OZEMPIC, 0.25 OR 0.5 MG/DOSE,) 2 MG/1.5ML SOPN Inject 0.25 mg into the skin every Tuesday.   Yes [provider]  simvastatin (ZOCOR) 40 MG tablet Take 40 mg by mouth at bedtime.  03/07/19  Yes [provider]  valACYclovir (VALTREX) 500 MG tablet Take 500 mg by mouth daily.  02/23/19  Yes [provider]  valsartan-hydrochlorothiazide (DIOVAN-HCT) 80-12.5 MG tablet Take 1 tablet by mouth daily. 10/30/20  Yes [provider]  vitamin B-12 (CYANOCOBALAMIN) 500 MCG tablet Take 500 mcg by mouth daily.   Yes [provider]  Zinc 25 MG TABS Take 25 mg by mouth daily.   Yes [provider]  EPINEPHrine 0.3 mg/0.3 mL IJ SOAJ injection Inject 0.3 mg into the muscle as needed for anaphylaxis. 10/07/20   Joanna Sia, DO    Scheduled Meds: Continuous Infusions: . lactated ringers 10 mL/hr at 12/18/20 0937   PRN Meds:.  Allergies as of 11/02/2020 -  Review Complete 10/08/2020  Allergen Reaction Noted  . Shellfish-derived products Anaphylaxis 09/15/2020  . Shellfish allergy Other (See Comments) 05/20/2019    Family History  Problem Relation Age of Onset  . Breast cancer Maternal Aunt     Social History   Socioeconomic History  . Marital status: Married    Spouse name: Not on file  . Number of children: 2  . Years of education: 42  . Highest education level: Not on file  Occupational History  . Not on file  Tobacco Use  . Smoking status: Former Smoker    Quit date: 1992    Years since quitting: 30.2  . Smokeless tobacco: Never Used  Vaping Use  . Vaping Use: Never used  Substance and Sexual Activity  . Alcohol use: Yes    Comment: occ   . Drug use: Never  . Sexual activity: Yes  Other Topics Concern  . Not on file  Social History Narrative  . Not on file   Social Determinants of Health   Financial Resource Strain: Not on file  Food Insecurity: Not on file  Transportation Needs: Not on file  Physical Activity: Not on file  Stress: Not on file  Social Connections: Not on file  Intimate Partner Violence: Not on file    Review of Systems: All negative except as stated above in HPI.  Physical Exam: Vital signs: Vitals:   12/18/20 0921  BP: (!) 150/94  Resp: 16  Temp: 98 F (36.7 C)  SpO2: 99%     General:   Obese, Well-developed, well-nourished, pleasant and cooperative in NAD Lungs:  Clear throughout to auscultation.   No wheezes, crackles, or rhonchi. No acute distress. Heart:  Regular rate and rhythm; no murmurs, clicks, rubs,  or gallops. Abdomen: soft, NT, ND, BS +  Rectal:  Deferred  GI:  Lab Results: No results for input(s): WBC, HGB, HCT, PLT in the last 72 hours. BMET No results for input(s): NA, K, CL, CO2, GLUCOSE, BUN, CREATININE, CALCIUM in the last 72 hours. LFT No results for input(s): PROT, ALBUMIN, AST, ALT, ALKPHOS, BILITOT, BILIDIR, IBILI in the last 72 hours. PT/INR No  results for input(s): LABPROT, INR in the last 72 hours.   Studies/Results: No results found.  Impression/Plan: -Iron deficiency anemia -Morbid obesity -Chronic GERD  Recommendations ----------------------- -Proceed with EGD and colonoscopy today.  Risks (bleeding, infection, bowel perforation that could require surgery, sedation-related changes in cardiopulmonary systems), benefits (identification and possible treatment of source of symptoms, exclusion of certain causes of symptoms), and alternatives (watchful waiting, radiographic imaging studies, empiric medical treatment)  were explained to patient/family in detail and patient wishes to proceed.    LOS: 0 days   Kathi Der  MD, FACP 12/18/2020, 10:10 AM  Contact #  [phone]

## 2020-12-18 NOTE — Anesthesia Postprocedure Evaluation (Signed)
Anesthesia Post Note  Patient: Joanna Cuevas  Procedure(s) Performed: ESOPHAGOGASTRODUODENOSCOPY (EGD) WITH PROPOFOL (N/A ) COLONOSCOPY WITH PROPOFOL (N/A ) BIOPSY     Patient location during evaluation: Endoscopy Anesthesia Type: MAC Level of consciousness: awake and alert Pain management: pain level controlled Vital Signs Assessment: post-procedure vital signs reviewed and stable Respiratory status: spontaneous breathing, nonlabored ventilation, respiratory function stable and patient connected to nasal cannula oxygen Cardiovascular status: blood pressure returned to baseline and stable Postop Assessment: no apparent nausea or vomiting Anesthetic complications: no   No complications documented.  Last Vitals:  Vitals:   12/18/20 1130 12/18/20 1140  BP: 126/79 114/71  Pulse:    Resp: 11 12  Temp:    SpO2: 100% 99%    Last Pain:  Vitals:   12/18/20 1140  TempSrc:   PainSc: 0-No pain                 Barnet Glasgow

## 2020-12-18 NOTE — Op Note (Signed)
[facility] Patient Name: Joanna Cuevas Procedure Date: 12/18/2020 MRN: [id number] Attending MD: Kathi Der , MD Date of Birth: 1966-08-27 CSN: [id number] Age: 55 Admit Type: Outpatient Procedure:                Upper GI endoscopy Indications:              Iron deficiency anemia, Heartburn Providers:                Kathi Der, MD, Vicki Mallet, RN, Leanne Lovely, Technician, Yevonne Pax, CRNA Referring MD:              Medicines:                Sedation Administered by an Anesthesia Professional Complications:            No immediate complications. Estimated Blood Loss:     Estimated blood loss was minimal. Procedure:                Pre-Anesthesia Assessment:                           - Prior to the procedure, a History and Physical                            was performed, and patient medications and                            allergies were reviewed. The patient's tolerance of                            previous anesthesia was also reviewed. The risks                            and benefits of the procedure and the sedation                            options and risks were discussed with the patient.                            All questions were answered, and informed consent                            was obtained. Prior Anticoagulants: The patient has                            taken no previous anticoagulant or antiplatelet                            agents. ASA Grade Assessment: III - A patient with                            severe systemic disease. After reviewing the risks  and benefits, the patient was deemed in                            satisfactory condition to undergo the procedure.                           After obtaining informed consent, the endoscope was                            passed under direct vision. Throughout the                            procedure, the patient's blood  pressure, pulse, and                            oxygen saturations were monitored continuously. The                            [id number]-H190 ([id number]) Olympus gastroscope was                            introduced through the mouth, and advanced to the                            second part of duodenum. The upper GI endoscopy was                            accomplished without difficulty. The patient                            tolerated the procedure well. Scope In: Scope Out: Findings:      The Z-line was regular and was found 40 cm from the incisors.      Scattered mild inflammation characterized by congestion (edema),       erythema and friability was found in the entire examined stomach.       Biopsies were taken with a cold forceps for histology.      A single 7 mm papule (nodule) with no bleeding and no stigmata of recent       bleeding was found in the gastric antrum. Biopsies were taken with a       cold forceps for histology.      The cardia and gastric fundus were normal on retroflexion.      The duodenal bulb and second portion of the duodenum were normal.      A moderate deformity/angulation was found in the first portion of the       duodenum, causing difficulty advancing scope in to D2. Impression:               - Z-line regular, 40 cm from the incisors.                           - Gastritis. Biopsied.                           - A single papule (nodule) found in the stomach.  Biopsied.                           - Normal duodenal bulb and second portion of the                            duodenum.                           - Duodenal deformity. Moderate Sedation:      Moderate (conscious) sedation was personally administered by an       anesthesia professional. The following parameters were monitored: oxygen       saturation, heart rate, blood pressure, and response to care. Recommendation:           - Perform a colonoscopy today. Procedure Code(s):         --- Professional ---                           [phone], Esophagogastroduodenoscopy, flexible,                            transoral; with biopsy, single or multiple Diagnosis Code(s):        --- Professional ---                           K29.70, Gastritis, unspecified, without bleeding                           K31.89, Other diseases of stomach and duodenum                           D50.9, Iron deficiency anemia, unspecified                           R12, Heartburn CPT copyright 2019 American Medical Association. All rights reserved. The codes documented in this report are preliminary and upon coder review may  be revised to meet current compliance requirements. Kathi Der, MD Kathi Der, MD 12/18/2020 11:14:03 AM Number of Addenda: 0

## 2020-12-18 NOTE — Op Note (Signed)
[facility] Patient Name: Joanna Cuevas Procedure Date: 12/18/2020 MRN: [id number] Attending MD: Kathi DerParag Haleemah Buckalew , MD Date of Birth: 10/10/1965 CSN: [id number] Age: 55 Admit Type: Outpatient Procedure:                Colonoscopy Indications:              Last colonoscopy: date unknown (unable to locate                            last colonoscopy report), Iron deficiency anemia Providers:                Kathi DerParag Dodi Leu, MD, Vicki MalletBrandy Grace, RN, Leanne LovelyAllison                            Townsend, Technician, Yevonne PaxElizabeth Pulliam, CRNA Referring MD:              Medicines:                Sedation Administered by an Anesthesia Professional Complications:            No immediate complications. Estimated Blood Loss:     Estimated blood loss was minimal. Procedure:                Pre-Anesthesia Assessment:                           - Prior to the procedure, a History and Physical                            was performed, and patient medications and                            allergies were reviewed. The patient's tolerance of                            previous anesthesia was also reviewed. The risks                            and benefits of the procedure and the sedation                            options and risks were discussed with the patient.                            All questions were answered, and informed consent                            was obtained. Prior Anticoagulants: The patient has                            taken no previous anticoagulant or antiplatelet                            agents. ASA Grade Assessment: III - A patient with  severe systemic disease. After reviewing the risks                            and benefits, the patient was deemed in                            satisfactory condition to undergo the procedure.                           After obtaining informed consent, the colonoscope                            was passed under  direct vision. Throughout the                            procedure, the patient's blood pressure, pulse, and                            oxygen saturations were monitored continuously. The                            PCF-H190DL ([id number]) Olympus pediatric colonscope                            was introduced through the anus and advanced to the                            the terminal ileum, with identification of the                            appendiceal orifice and IC valve. The colonoscopy                            was performed with moderate difficulty due to the                            patient's body habitus. Successful completion of                            the procedure was aided by applying abdominal                            pressure. The patient tolerated the procedure well.                            The quality of the bowel preparation was fair. Scope In: 10:25:47 AM Scope Out: 11:01:18 AM Scope Withdrawal Time: 0 hours 18 minutes 6 seconds  Total Procedure Duration: 0 hours 35 minutes 31 seconds  Findings:      The perianal and digital rectal examinations were normal.      The terminal ileum appeared normal.      Copious quantities of liquid semi-liquid stool was found in the entire       colon, interfering with visualization. Lavage of the area was performed  using 1200 ML, resulting in clearance with fair visualization.      A 3 mm polyp was found in the sigmoid colon. The polyp was sessile. The       polyp was removed with a cold snare. Resection and retrieval were       complete.      A 5 mm polyp was found in the distal rectum. The polyp was sessile. The       polyp was removed with a cold snare. Resection and retrieval were       complete.      A few small-mouthed diverticula were found in the sigmoid colon.      Internal hemorrhoids were found during retroflexion. The hemorrhoids       were medium-sized. Impression:               - Preparation of the colon  was fair.                           - The examined portion of the ileum was normal.                           - Stool in the entire examined colon.                           - One 3 mm polyp in the sigmoid colon, removed with                            a cold snare. Resected and retrieved.                           - One 5 mm polyp in the distal rectum, removed with                            a cold snare. Resected and retrieved.                           - Diverticulosis in the sigmoid colon.                           - Internal hemorrhoids. Moderate Sedation:      Moderate (conscious) sedation was personally administered by an       anesthesia professional. The following parameters were monitored: oxygen       saturation, heart rate, blood pressure, and response to care. Recommendation:           - Patient has a contact number available for                            emergencies. The signs and symptoms of potential                            delayed complications were discussed with the                            patient. Return to normal activities tomorrow.  Written discharge instructions were provided to the                            patient.                           - Resume previous diet.                           - Continue present medications.                           - Await pathology results.                           - Repeat colonoscopy date to be determined after                            pending pathology results are reviewed for                            surveillance based on pathology results.                           - Return to GI office in 6 months. Procedure Code(s):        --- Professional ---                           [phone], Colonoscopy, flexible; with removal of                            tumor(s), polyp(s), or other lesion(s) by snare                            technique Diagnosis Code(s):        --- Professional ---                            K64.8, Other hemorrhoids                           K63.5, Polyp of colon                           K62.1, Rectal polyp                           D50.9, Iron deficiency anemia, unspecified                           K57.30, Diverticulosis of large intestine without                            perforation or abscess without bleeding CPT copyright 2019 American Medical Association. All rights reserved. The codes documented in this report are preliminary and upon coder review may  be revised to meet current compliance requirements. Kathi Der, MD Kathi Der, MD 12/18/2020 11:19:54 AM Number  of Addenda: 0

## 2020-12-18 NOTE — Transfer of Care (Signed)
Immediate Anesthesia Transfer of Care Note  Patient: Joanna Cuevas  Procedure(s) Performed: ESOPHAGOGASTRODUODENOSCOPY (EGD) WITH PROPOFOL (N/A ) COLONOSCOPY WITH PROPOFOL (N/A ) BIOPSY  Patient Location: PACU  Anesthesia Type:MAC  Level of Consciousness: sedated  Airway & Oxygen Therapy: Patient Spontanous Breathing and Patient connected to face mask oxygen  Post-op Assessment: Report given to RN and Post -op Vital signs reviewed and stable  Post vital signs: Reviewed and stable  Last Vitals:  Vitals Value Taken Time  BP    Temp    Pulse    Resp    SpO2      Last Pain:  Vitals:   12/18/20 0921  TempSrc: Axillary  PainSc: 0-No pain         Complications: No complications documented.

## 2020-12-20 ENCOUNTER — Encounter (HOSPITAL_COMMUNITY): Payer: Self-pay | Admitting: Gastroenterology

## 2020-12-21 DIAGNOSIS — G4721 Circadian rhythm sleep disorder, delayed sleep phase type: Secondary | ICD-10-CM | POA: Diagnosis not present

## 2020-12-21 DIAGNOSIS — G4733 Obstructive sleep apnea (adult) (pediatric): Secondary | ICD-10-CM | POA: Diagnosis not present

## 2020-12-21 LAB — SURGICAL PATHOLOGY

## 2020-12-28 DIAGNOSIS — G629 Polyneuropathy, unspecified: Secondary | ICD-10-CM | POA: Diagnosis not present

## 2020-12-28 DIAGNOSIS — M75101 Unspecified rotator cuff tear or rupture of right shoulder, not specified as traumatic: Secondary | ICD-10-CM | POA: Diagnosis not present

## 2020-12-28 DIAGNOSIS — G894 Chronic pain syndrome: Secondary | ICD-10-CM | POA: Diagnosis not present

## 2021-01-28 ENCOUNTER — Other Ambulatory Visit: Payer: Self-pay | Admitting: Physician Assistant

## 2021-01-28 ENCOUNTER — Other Ambulatory Visit: Payer: Self-pay

## 2021-01-28 ENCOUNTER — Ambulatory Visit
Admission: RE | Admit: 2021-01-28 | Discharge: 2021-01-28 | Disposition: A | Discharge status: Home / Self Care | Payer: Medicare Other | Source: Ambulatory Visit | Attending: Physician Assistant | Admitting: Physician Assistant

## 2021-01-28 DIAGNOSIS — G629 Polyneuropathy, unspecified: Secondary | ICD-10-CM | POA: Diagnosis not present

## 2021-01-28 DIAGNOSIS — M47817 Spondylosis without myelopathy or radiculopathy, lumbosacral region: Secondary | ICD-10-CM | POA: Diagnosis not present

## 2021-01-28 DIAGNOSIS — M65811 Other synovitis and tenosynovitis, right shoulder: Secondary | ICD-10-CM | POA: Diagnosis not present

## 2021-01-28 DIAGNOSIS — G894 Chronic pain syndrome: Secondary | ICD-10-CM | POA: Diagnosis not present

## 2021-01-28 DIAGNOSIS — M1712 Unilateral primary osteoarthritis, left knee: Secondary | ICD-10-CM | POA: Diagnosis not present

## 2021-01-28 DIAGNOSIS — R52 Pain, unspecified: Secondary | ICD-10-CM

## 2021-01-28 DIAGNOSIS — M76892 Other specified enthesopathies of left lower limb, excluding foot: Secondary | ICD-10-CM | POA: Diagnosis not present

## 2021-02-23 ENCOUNTER — Encounter: Payer: Self-pay | Admitting: Cardiology

## 2021-02-23 ENCOUNTER — Ambulatory Visit (INDEPENDENT_AMBULATORY_CARE_PROVIDER_SITE_OTHER): Payer: Medicare Other | Admitting: Cardiology

## 2021-02-23 ENCOUNTER — Other Ambulatory Visit: Payer: Self-pay

## 2021-02-23 VITALS — BP 116/70 | HR 102 | Ht 66.0 in | Wt 333.0 lb

## 2021-02-23 DIAGNOSIS — R0602 Shortness of breath: Secondary | ICD-10-CM

## 2021-02-23 DIAGNOSIS — E7800 Pure hypercholesterolemia, unspecified: Secondary | ICD-10-CM

## 2021-02-23 NOTE — Patient Instructions (Signed)
Medication Instructions:  The current medical regimen is effective;  continue present plan and medications.  *If you need a refill on your cardiac medications before your next appointment, please call your pharmacy*  Follow-Up: At [facility], you and your health needs are our priority.  As part of our continuing mission to provide you with exceptional heart care, we have created designated Provider Care Teams.  These Care Teams include your primary Cardiologist (physician) and Advanced Practice Providers (APPs -  Physician Assistants and Nurse Practitioners) who all work together to provide you with the care you need, when you need it.  We recommend signing up for the patient portal called "MyChart".  Sign up information is provided on this After Visit Summary.  MyChart is used to connect with patients for Virtual Visits (Telemedicine).  Patients are able to view lab/test results, encounter notes, upcoming appointments, etc.  Non-urgent messages can be sent to your provider as well.   To learn more about what you can do with MyChart, go to [email or web address].    Your next appointment:   12 month(s)  The format for your next appointment:   In Person  Provider:   Mark Skains, MD   Thank you for choosing [facility]!!      

## 2021-02-23 NOTE — Progress Notes (Signed)
Cardiology Office Note:    Date:  02/23/2021   ID:  Joanna Cuevas, DOB 04-22-1966, MRN [id number]  PCP:  [name], MD   [facility] Providers Cardiologist:  Donato Schultz, MD     Referring MD: [name], *     History of Present Illness:    Joanna Cuevas is a 55 y.o. female here for the follow-up dyspnea on exertion previously seen in December 2021 at the request of Dr. Chanetta Marshall.  Bumex was utilized.  Monday Wednesday Friday.  Continuing to watch salt.  Has a shellfish allergy.  Prior LDL from outside lab was 110 hemoglobin 11 creatinine 0.7.  Drives back and forth from [place] frequently.  Recently moved up here to [facility].  She enjoys going back to visit her mother, grandkids etc.  Overall she has been feeling fairly well.  Occasionally when she lays down at night she may feel some left upper chest discomfort that is fleeting and goes away when she twists her body or rubs her upper chest.  Likely musculoskeletal.  She is not experiencing any exertional chest discomfort.  No bruising no syncope no bleeding.  Past Medical History:  Diagnosis Date  . ADHD   . Anemia   . Angio-edema   . Arthritis   . Chronic pain syndrome   . Diabetes mellitus without complication (HCC)    Type II  . Diastolic CHF (HCC)   . Disordered sleep   . DOE (dyspnea on exertion)   . Genital herpes simplex   . GERD (gastroesophageal reflux disease)   . HSV (herpes simplex virus) anogenital infection   . Hypercholesterolemia   . Hypertension   . Insomnia   . Kidney stones   . Morbid obesity (HCC)   . OSA on CPAP    cpap - does not know settings   . Peripheral neuropathy   . Shellfish allergy     Past Surgical History:  Procedure Laterality Date  . ABDOMINAL HYSTERECTOMY    . ADENOIDECTOMY    . ANAL FISSURECTOMY    . BIOPSY  12/18/2020   Procedure: BIOPSY;  Surgeon: Kathi Der, MD;  Location: [facility];  Service: Gastroenterology;;  . Bladder Tuck    .  BREAST LUMPECTOMY  1992   benign - removed fatty tissue under both arms   . CESAREAN SECTION    . CHOLECYSTECTOMY    . COLONOSCOPY WITH PROPOFOL N/A 12/18/2020   Procedure: COLONOSCOPY WITH PROPOFOL;  Surgeon: Kathi Der, MD;  Location: [facility];  Service: Gastroenterology;  Laterality: N/A;  . CYSTOSCOPY WITH RETROGRADE PYELOGRAM, URETEROSCOPY AND STENT PLACEMENT Bilateral 06/11/2020   Procedure: CYSTOSCOPY WITH BILATERAL RETROGRADE PYELOGRAM, LEFT URETEROSCOPY LASER LITHOTRIPSY AND STENT PLACEMENT, LEFT ENDOPYELOTOMY;  Surgeon: Crist Fat, MD;  Location: [facility];  Service: Urology;  Laterality: Bilateral;  . ENDOMETRIAL ABLATION    . ENDOVENOUS ABLATION SAPHENOUS VEIN W/ LASER    . ESOPHAGOGASTRODUODENOSCOPY (EGD) WITH PROPOFOL N/A 12/18/2020   Procedure: ESOPHAGOGASTRODUODENOSCOPY (EGD) WITH PROPOFOL;  Surgeon: Kathi Der, MD;  Location: [facility];  Service: Gastroenterology;  Laterality: N/A;  . HERNIA REPAIR    . KIDNEY STONE SURGERY    . POLYPECTOMY  12/18/2020   Procedure: POLYPECTOMY;  Surgeon: Kathi Der, MD;  Location: [facility];  Service: Gastroenterology;;  . TONSILLECTOMY    . TOTAL VAGINAL HYSTERECTOMY    . TUBAL LIGATION      Current Medications: Current Meds  Medication Sig  . AMITIZA 24 MCG capsule Take 24 mcg  by mouth daily.   Marland Kitchen amphetamine-dextroamphetamine (ADDERALL XR) 30 MG 24 hr capsule Take 30 mg by mouth daily.   . bumetanide (BUMEX) 1 MG tablet Take 2 mg by mouth every Monday, Wednesday, and Friday. In the morning.  . Carboxymethylcellulose Sodium 1 % GEL Place 1 drop into both eyes daily as needed (dry eyes).  . cholecalciferol (VITAMIN D) 25 MCG (1000 UNIT) tablet Take 1,000 Units by mouth daily.  . cycloSPORINE (RESTASIS) 0.05 % ophthalmic emulsion Place 1 drop into both eyes 2 (two) times daily.  Marland Kitchen ELDERBERRY PO Take 200 mg by mouth daily. 100 mg/per tablet  . EPINEPHrine 0.3 mg/0.3 mL IJ SOAJ injection Inject 0.3 mg into  the muscle as needed for anaphylaxis.  . ferrous sulfate 325 (65 FE) MG tablet Take 325 mg by mouth daily.  . fluticasone (FLONASE) 50 MCG/ACT nasal spray Place 1-2 sprays into both nostrils 3 (three) times daily as needed for allergies.  Marland Kitchen gabapentin (NEURONTIN) 300 MG capsule Take 600-1,200 mg by mouth See admin instructions. Take 2 capsules (600 mg) by mouth in the morning & take 4 capsules (1200 mg) by mouth at bedtime.  Marland Kitchen HYDROcodone-acetaminophen (NORCO) 7.5-325 MG tablet Take 1-2 tablets by mouth 4 (four) times daily as needed (pain.).  Marland Kitchen Lancets (ONETOUCH DELICA PLUS LANCET30G) MISC USE AS DIRECTED TO CHECK BLOOD SUGAR  . metFORMIN (GLUCOPHAGE) 1000 MG tablet Take 1,000 mg by mouth in the morning and at bedtime.   . metoCLOPramide (REGLAN) 5 MG tablet Take 5 mg by mouth 2 (two) times daily as needed for nausea.   Letta Pate VERIO test strip USE STRIP TO CHECK GLUCOSE TWICE DAILY  . pantoprazole (PROTONIX) 40 MG tablet Take 40 mg by mouth daily as needed (acid reflux/indigestion.).   Marland Kitchen potassium chloride (KLOR-CON) 10 MEQ tablet Take 10 mEq by mouth See admin instructions. Take 1 tablet (10 meq) by mouth twice daily every other day  . PREMARIN 0.625 MG tablet Take 0.625 mg by mouth at bedtime.   . Semaglutide,0.25 or 0.5MG /DOS, (OZEMPIC, 0.25 OR 0.5 MG/DOSE,) 2 MG/1.5ML SOPN Inject 0.25 mg into the skin every Tuesday.  . simvastatin (ZOCOR) 40 MG tablet Take 40 mg by mouth at bedtime.   . valACYclovir (VALTREX) 500 MG tablet Take 500 mg by mouth daily.   . valsartan-hydrochlorothiazide (DIOVAN-HCT) 80-12.5 MG tablet Take 1 tablet by mouth daily.  . vitamin B-12 (CYANOCOBALAMIN) 500 MCG tablet Take 500 mcg by mouth daily.  . Zinc 25 MG TABS Take 25 mg by mouth daily.     Allergies:   Shellfish-derived products and Shellfish allergy   Social History   Socioeconomic History  . Marital status: Married    Spouse name: Not on file  . Number of children: 2  . Years of education: 39  .  Highest education level: Not on file  Occupational History  . Not on file  Tobacco Use  . Smoking status: Former Smoker    Quit date: 1992    Years since quitting: 30.4  . Smokeless tobacco: Never Used  Vaping Use  . Vaping Use: Never used  Substance and Sexual Activity  . Alcohol use: Yes    Comment: occ   . Drug use: Never  . Sexual activity: Yes  Other Topics Concern  . Not on file  Social History Narrative  . Not on file   Social Determinants of Health   Financial Resource Strain: Not on file  Food Insecurity: Not on file  Transportation Needs:  Not on file  Physical Activity: Not on file  Stress: Not on file  Social Connections: Not on file     Family History: The patient's family history includes Breast cancer in her maternal aunt.  ROS:   Please see the history of present illness.     All other systems reviewed and are negative.  EKGs/Labs/Other Studies Reviewed:    The following studies were reviewed today:  ECHO 10/2019:  1. Left ventricular ejection fraction, by estimation, is 60 to 65%. The  left ventricle has normal function. The left ventricle has no regional  wall motion abnormalities. Left ventricular diastolic parameters were  normal.  2. Right ventricular systolic function is normal. The right ventricular  size is normal. Tricuspid regurgitation signal is inadequate for assessing  PA pressure.  3. The mitral valve is normal in structure and function. No evidence of  mitral valve regurgitation. No evidence of mitral stenosis.  4. The aortic valve is tricuspid. Aortic valve regurgitation is not  visualized. No aortic stenosis is present.  5. The inferior vena cava is normal in size with greater than 50%  respiratory variability, suggesting right atrial pressure of 3 mmHg.     Recent Labs: 06/05/2020: BUN 12; Creatinine, Ser 0.87; Hemoglobin 11.3; Platelets 332; Potassium 4.0; Sodium 140  Recent Lipid Panel No results found for: CHOL,  TRIG, HDL, CHOLHDL, VLDL, LDLCALC, LDLDIRECT   Risk Assessment/Calculations:      Physical Exam:    VS:  BP 116/70 (BP Location: Left Arm, Patient Position: Sitting, Cuff Size: Normal)   Pulse (!) 102   Ht 5\' 6"  (1.676 m)   Wt (!) 333 lb (151 kg)   SpO2 95%   BMI 53.75 kg/m     Wt Readings from Last 3 Encounters:  02/23/21 (!) 333 lb (151 kg)  12/14/20 (!) 340 lb (154.2 kg)  10/07/20 (!) 337 lb 12.8 oz (153.2 kg)     GEN:  Well nourished, well developed in no acute distress HEENT: Normal NECK: No JVD; No carotid bruits LYMPHATICS: No lymphadenopathy CARDIAC: RRR, no murmurs, rubs, gallops RESPIRATORY:  Clear to auscultation without rales, wheezing or rhonchi  ABDOMEN: Soft, non-tender, non-distended MUSCULOSKELETAL:  No edema; No deformity  SKIN: Warm and dry NEUROLOGIC:  Alert and oriented x 3 PSYCHIATRIC:  Normal affect   ASSESSMENT:    1. Shortness of breath   2. Pure hypercholesterolemia    PLAN:    In order of problems listed above:  Dyspnea - Multifactorial, echocardiogram reviewed once again and reassuring. - Bumex administered Monday Wednesday and Friday.  Additional if needed. - Counseled her to take potassium when she takes her Bumex.  Magnesium may be helpful as well for cramping. - Under[place] to stop every 2-3 hours to move and stretch. Does continue to monitor weight loss.  Hyperlipidemia - Continue with simvastatin 40 mg once at bedtime.  No change in medical management reviewed.  Refills as necessary.  Last LDL 110.  Hemoglobin A1c 6.0 creatinine 0.74 potassium 3.7.   1 year follow  Medication Adjustments/Labs and Tests Ordered: Current medicines are reviewed at length with the patient today.  Concerns regarding medicines are outlined above.  No orders of the defined types were placed in this encounter.  No orders of the defined types were placed in this encounter.   Patient Instructions  Medication  Instructions:  The current medical regimen is effective;  continue present plan and medications.  *If you need a  refill on your cardiac medications before your next appointment, please call your pharmacy*  Follow-Up: At [facility], you and your health needs are our priority.  As part of our continuing mission to provide you with exceptional heart care, we have created designated Provider Care Teams.  These Care Teams include your primary Cardiologist (physician) and Advanced Practice Providers (APPs -  Physician Assistants and Nurse Practitioners) who all work together to provide you with the care you need, when you need it.  We recommend signing up for the patient portal called "MyChart".  Sign up information is provided on this After Visit Summary.  MyChart is used to connect with patients for Virtual Visits (Telemedicine).  Patients are able to view lab/test results, encounter notes, upcoming appointments, etc.  Non-urgent messages can be sent to your provider as well.   To learn more about what you can do with MyChart, go to [email or web address].    Your next appointment:   12 month(s)  The format for your next appointment:   In Person  Provider:   Donato Schultz, MD  Thank you for choosing [facility]!!        Signed, Donato Schultz, MD  02/23/2021 12:01 PM    [facility]

## 2021-02-24 DIAGNOSIS — M75101 Unspecified rotator cuff tear or rupture of right shoulder, not specified as traumatic: Secondary | ICD-10-CM | POA: Diagnosis not present

## 2021-02-24 DIAGNOSIS — M65811 Other synovitis and tenosynovitis, right shoulder: Secondary | ICD-10-CM | POA: Diagnosis not present

## 2021-02-24 DIAGNOSIS — G894 Chronic pain syndrome: Secondary | ICD-10-CM | POA: Diagnosis not present

## 2021-02-24 DIAGNOSIS — M47817 Spondylosis without myelopathy or radiculopathy, lumbosacral region: Secondary | ICD-10-CM | POA: Diagnosis not present

## 2021-02-24 DIAGNOSIS — Z79899 Other long term (current) drug therapy: Secondary | ICD-10-CM | POA: Diagnosis not present

## 2021-02-24 DIAGNOSIS — Z79891 Long term (current) use of opiate analgesic: Secondary | ICD-10-CM | POA: Diagnosis not present

## 2021-03-01 DIAGNOSIS — M47817 Spondylosis without myelopathy or radiculopathy, lumbosacral region: Secondary | ICD-10-CM | POA: Diagnosis not present

## 2021-03-01 DIAGNOSIS — M65811 Other synovitis and tenosynovitis, right shoulder: Secondary | ICD-10-CM | POA: Diagnosis not present

## 2021-03-01 DIAGNOSIS — M75101 Unspecified rotator cuff tear or rupture of right shoulder, not specified as traumatic: Secondary | ICD-10-CM | POA: Diagnosis not present

## 2021-03-15 DIAGNOSIS — M47817 Spondylosis without myelopathy or radiculopathy, lumbosacral region: Secondary | ICD-10-CM | POA: Diagnosis not present

## 2021-03-16 DIAGNOSIS — G4733 Obstructive sleep apnea (adult) (pediatric): Secondary | ICD-10-CM | POA: Diagnosis not present

## 2021-03-17 DIAGNOSIS — H524 Presbyopia: Secondary | ICD-10-CM | POA: Diagnosis not present

## 2021-03-17 DIAGNOSIS — H5213 Myopia, bilateral: Secondary | ICD-10-CM | POA: Diagnosis not present

## 2021-03-17 DIAGNOSIS — E113293 Type 2 diabetes mellitus with mild nonproliferative diabetic retinopathy without macular edema, bilateral: Secondary | ICD-10-CM | POA: Diagnosis not present

## 2021-03-17 DIAGNOSIS — H52221 Regular astigmatism, right eye: Secondary | ICD-10-CM | POA: Diagnosis not present

## 2021-03-17 DIAGNOSIS — H16222 Keratoconjunctivitis sicca, not specified as Sjogren's, left eye: Secondary | ICD-10-CM | POA: Diagnosis not present

## 2021-04-19 ENCOUNTER — Encounter: Payer: Self-pay | Admitting: Allergy

## 2021-04-19 ENCOUNTER — Ambulatory Visit (INDEPENDENT_AMBULATORY_CARE_PROVIDER_SITE_OTHER): Payer: Medicare Other | Admitting: Allergy

## 2021-04-19 ENCOUNTER — Other Ambulatory Visit: Payer: Self-pay

## 2021-04-19 VITALS — BP 145/90 | HR 92 | Ht 66.5 in | Wt 321.0 lb

## 2021-04-19 DIAGNOSIS — T781XXD Other adverse food reactions, not elsewhere classified, subsequent encounter: Secondary | ICD-10-CM

## 2021-04-19 DIAGNOSIS — J3089 Other allergic rhinitis: Secondary | ICD-10-CM

## 2021-04-19 NOTE — Assessment & Plan Note (Addendum)
Past history - Multiple episodes of reactions to shrimp in the form of facial swelling, throat itching, emesis, trouble breathing. First episode required hospitalization. Other episodes treated at home with benadryl. Symptoms now occur by smelling shrimp without ingestion.Tolerates catfish. Avoiding mollusks as well. Pecan cause emesis, peanuts cause diarrhea. 2022 skin testing was negative to tree nuts, peanuts, mollusks.  Interim history - 2022 bloodwork positive to shrimp and borderline positive to hazelnut, walnut, peanut, macadamia nut.   Continue strict avoidance of shellfish, tree nuts.  Okay to eat finned fish as before.  May try peanuts at home.   For mild symptoms you can take over the counter antihistamines such as Benadryl and monitor symptoms closely. If symptoms worsen or if you have severe symptoms including breathing issues, throat closure, significant swelling, whole body hives, severe diarrhea and vomiting, lightheadedness then inject epinephrine and seek immediate medical care afterwards.  Action plan in place.   Discuss tree nut introduction at next visit.

## 2021-04-19 NOTE — Progress Notes (Signed)
RE: Joanna Cuevas MRN: [id number] DOB: December 16, 1965 Date of Telemedicine Visit: 04/19/2021  Referring provider: Shon Cuevas, * Primary care provider: [name], MD  Chief Complaint: Food Intolerance (Say has no had a breakout.)   Telemedicine Follow Up Visit via Telephone: I connected with Joanna Cuevas for a follow up on 04/19/21 by telephone and verified that I am speaking with the correct person using two identifiers.   I discussed the limitations, risks, security and privacy concerns of performing an evaluation and management service by telephone and the availability of in person appointments. I also discussed with the patient that there may be a patient responsible charge related to this service. The patient expressed understanding and agreed to proceed.  Patient is at home. Provider is at the office.  Visit start time: 12:10PM Visit end time: 12:20PM Insurance consent/check in by: front desk Medical consent and medical assistant/nurse: Cree K.  History of Present Illness: She is a 55 y.o. female, who is being followed for food allergy and allergic rhinitis. Her previous allergy office visit was on 10/07/2020 with Dr. Selena Batten. Today is a regular follow up visit.  Adverse reaction to food Currently avoiding shellfish and tree nuts. Tolerating fish.   No reactions since the last visit.   Other allergic rhinitis Uses Flonase and allegra as needed with good benefit.  No nosebleeds.  Having issues with some hip pain.  Assessment and Plan: Julyana is a 55 y.o. female with: Adverse reaction to food, subsequent encounter Past history - Multiple episodes of reactions to shrimp in the form of facial swelling, throat itching, emesis, trouble breathing. First episode required hospitalization. Other episodes treated at home with benadryl. Symptoms now occur by smelling shrimp without ingestion.Tolerates catfish. Avoiding mollusks as well. Pecan cause emesis, peanuts cause  diarrhea. 2022 skin testing was negative to tree nuts, peanuts, mollusks.  Interim history - 2022 bloodwork positive to shrimp and borderline positive to hazelnut, walnut, peanut, macadamia nut.  Continue strict avoidance of shellfish, tree nuts. Okay to eat finned fish as before. May try peanuts at home.  For mild symptoms you can take over the counter antihistamines such as Benadryl and monitor symptoms closely. If symptoms worsen or if you have severe symptoms including breathing issues, throat closure, significant swelling, whole body hives, severe diarrhea and vomiting, lightheadedness then inject epinephrine and seek immediate medical care afterwards. Action plan in place.  Discuss tree nut introduction at next visit.   Other allergic rhinitis Past history - Rhinoconjunctivitis symptoms in the spring and takes over-the-counter antihistamines with good benefit. 2022 skin testing was positive to cockroaches. Borderline to mold.  Interim history - 2022 bloodwork positive to dust mites, cockroaches. Uses allegra and Flonase prn as the pollen seems to bother her.  Continue environmental control measures. Use over the counter antihistamines such as Zyrtec (cetirizine), Claritin (loratadine), Allegra (fexofenadine), or Xyzal (levocetirizine) daily as needed. May take twice a day during allergy flares. May switch antihistamines every few months. Use Flonase (fluticasone) nasal spray 1 spray per nostril twice a day as needed for nasal congestion.   Return in about 6 months (around 10/20/2021).  No orders of the defined types were placed in this encounter.  Lab Orders  No laboratory test(s) ordered today    Diagnostics: None.  Medication List:  Current Outpatient Medications  Medication Sig Dispense Refill   AMITIZA 24 MCG capsule Take 24 mcg by mouth daily.      amphetamine-dextroamphetamine (ADDERALL XR) 30 MG 24 hr capsule  Take 30 mg by mouth daily.      bumetanide (BUMEX) 1 MG tablet  Take 2 mg by mouth every Monday, Wednesday, and Friday. In the morning.     Carboxymethylcellulose Sodium 1 % GEL Place 1 drop into both eyes daily as needed (dry eyes).     cholecalciferol (VITAMIN D) 25 MCG (1000 UNIT) tablet Take 1,000 Units by mouth daily.     cycloSPORINE (RESTASIS) 0.05 % ophthalmic emulsion Place 1 drop into both eyes 2 (two) times daily.     ELDERBERRY PO Take 200 mg by mouth daily. 100 mg/per tablet     EPINEPHrine 0.3 mg/0.3 mL IJ SOAJ injection Inject 0.3 mg into the muscle as needed for anaphylaxis. 1 each 2   ferrous sulfate 325 (65 FE) MG tablet Take 325 mg by mouth daily.     FLUoxetine (PROZAC) 40 MG capsule Take 40 mg by mouth every morning.     fluticasone (FLONASE) 50 MCG/ACT nasal spray Place 1-2 sprays into both nostrils 3 (three) times daily as needed for allergies.     gabapentin (NEURONTIN) 300 MG capsule Take 600-1,200 mg by mouth See admin instructions. Take 2 capsules (600 mg) by mouth in the morning & take 4 capsules (1200 mg) by mouth at bedtime.     HYDROcodone-acetaminophen (NORCO) 7.5-325 MG tablet Take 1-2 tablets by mouth 4 (four) times daily as needed (pain.). 15 tablet 0   Lancets (ONETOUCH DELICA PLUS LANCET30G) MISC USE AS DIRECTED TO CHECK BLOOD SUGAR     metFORMIN (GLUCOPHAGE) 1000 MG tablet Take 1,000 mg by mouth in the morning and at bedtime.      metoCLOPramide (REGLAN) 5 MG tablet Take 5 mg by mouth 2 (two) times daily as needed for nausea.      ONETOUCH VERIO test strip USE STRIP TO CHECK GLUCOSE TWICE DAILY     pantoprazole (PROTONIX) 40 MG tablet Take 40 mg by mouth daily as needed (acid reflux/indigestion.).      potassium chloride (KLOR-CON) 10 MEQ tablet Take 10 mEq by mouth See admin instructions. Take 1 tablet (10 meq) by mouth twice daily every other day     Semaglutide,0.25 or 0.5MG /DOS, (OZEMPIC, 0.25 OR 0.5 MG/DOSE,) 2 MG/1.5ML SOPN Inject 0.25 mg into the skin every Tuesday.     simvastatin (ZOCOR) 40 MG tablet Take 40 mg by  mouth at bedtime.      valACYclovir (VALTREX) 500 MG tablet Take 500 mg by mouth daily.      valsartan-hydrochlorothiazide (DIOVAN-HCT) 80-12.5 MG tablet Take 1 tablet by mouth daily.     vitamin B-12 (CYANOCOBALAMIN) 500 MCG tablet Take 500 mcg by mouth daily.     Zinc 25 MG TABS Take 25 mg by mouth daily.     No current facility-administered medications for this visit.   Allergies: Allergies  Allergen Reactions   Shellfish-Derived Products Anaphylaxis   Shellfish Allergy Other (See Comments)    Throat burning   I reviewed her past medical history, social history, family history, and environmental history and no significant changes have been reported from her previous visit.  Review of Systems  Constitutional:  Negative for appetite change, chills, fever and unexpected weight change.  HENT:  Negative for congestion and rhinorrhea.   Eyes:  Negative for itching.  Respiratory:  Negative for cough, chest tightness, shortness of breath and wheezing.   Cardiovascular:  Negative for chest pain.  Gastrointestinal:  Negative for abdominal pain.  Genitourinary:  Negative for difficulty urinating.  Musculoskeletal:  Positive for arthralgias.  Skin:  Negative for rash.  Allergic/Immunologic: Positive for environmental allergies and food allergies.   Objective: Physical Exam Not obtained as encounter was done via telephone.   Previous notes and tests were reviewed.  I discussed the assessment and treatment plan with the patient. The patient was provided an opportunity to ask questions and all were answered. The patient agreed with the plan and demonstrated an understanding of the instructions. After visit summary/patient instructions available via mychart.   The patient was advised to call back or seek an in-person evaluation if the symptoms worsen or if the condition fails to improve as anticipated.  I provided 10 minutes of non-face-to-face time during this encounter.  It was my  pleasure to participate in Carlton Alessio's care today. Please feel free to contact me with any questions or concerns.   Sincerely,  Wyline Mood, DO Allergy & Immunology  [facility] office: [phone] [facility] office: [phone]

## 2021-04-19 NOTE — Patient Instructions (Addendum)
Food allergy: Continue strict avoidance of shellfish, tree nuts. Okay to eat finned fish and peanuts as before.   For mild symptoms you can take over the counter antihistamines such as Benadryl and monitor symptoms closely. If symptoms worsen or if you have severe symptoms including breathing issues, throat closure, significant swelling, whole body hives, severe diarrhea and vomiting, lightheadedness then inject epinephrine and seek immediate medical care afterwards. Action plan in place.   Environmental allergies: 2022 skin testing: positive to cockroaches, borderline to mold. Bloodwork positive to dust mites. Continue environmental control measures. Use over the counter antihistamines such as Zyrtec (cetirizine), Claritin (loratadine), Allegra (fexofenadine), or Xyzal (levocetirizine) daily as needed. May take twice a day during allergy flares. May switch antihistamines every few months. Use Flonase (fluticasone) nasal spray 1 spray per nostril twice a day as needed for nasal congestion.   Follow up in 6 months or sooner if needed.   Control of House Dust Mite Allergen Dust mite allergens are a common trigger of allergy and asthma symptoms. While they can be found throughout the house, these microscopic creatures thrive in warm, humid environments such as bedding, upholstered furniture and carpeting. Because so much time is spent in the bedroom, it is essential to reduce mite levels there.  Encase pillows, mattresses, and box springs in special allergen-proof fabric covers or airtight, zippered plastic covers.  Bedding should be washed weekly in hot water (130 F) and dried in a hot dryer. Allergen-proof covers are available for comforters and pillows that can't be regularly washed.  Wash the allergy-proof covers every few months. Minimize clutter in the bedroom. Keep pets out of the bedroom.  Keep humidity less than 50% by using a dehumidifier or air conditioning. You can buy a humidity  measuring device called a hygrometer to monitor this.  If possible, replace carpets with hardwood, linoleum, or washable area rugs. If that's not possible, vacuum frequently with a vacuum that has a HEPA filter. Remove all upholstered furniture and non-washable window drapes from the bedroom. Remove all non-washable stuffed toys from the bedroom.  Wash stuffed toys weekly.  Cockroach Allergen Avoidance Cockroaches are often found in the homes of densely populated urban areas, schools or commercial buildings, but these creatures can lurk almost anywhere. This does not mean that you have a dirty house or living area. Block all areas where roaches can enter the home. This includes crevices, wall cracks and windows.  Cockroaches need water to survive, so fix and seal all leaky faucets and pipes. Have an exterminator go through the house when your family and pets are gone to eliminate any remaining roaches. Keep food in lidded containers and put pet food dishes away after your pets are done eating. Vacuum and sweep the floor after meals, and take out garbage and recyclables. Use lidded garbage containers in the kitchen. Wash dishes immediately after use and clean under stoves, refrigerators or toasters where crumbs can accumulate. Wipe off the stove and other kitchen surfaces and cupboards regularly.

## 2021-04-19 NOTE — Assessment & Plan Note (Addendum)
Past history - Rhinoconjunctivitis symptoms in the spring and takes over-the-counter antihistamines with good benefit. 2022 skin testing was positive to cockroaches. Borderline to mold.  Interim history - 2022 bloodwork positive to dust mites, cockroaches. Uses allegra and Flonase prn as the pollen seems to bother her.   Continue environmental control measures.  Use over the counter antihistamines such as Zyrtec (cetirizine), Claritin (loratadine), Allegra (fexofenadine), or Xyzal (levocetirizine) daily as needed. May take twice a day during allergy flares. May switch antihistamines every few months.  Use Flonase (fluticasone) nasal spray 1 spray per nostril twice a day as needed for nasal congestion.

## 2022-02-10 IMAGING — MG MM DIGITAL SCREENING BILAT W/ TOMO AND CAD
8 series · 8 of 24 positions shown · non-contrast
Comparison: Previous exam(s).

CLINICAL DATA: Screening.

EXAM:
DIGITAL SCREENING BILATERAL MAMMOGRAM WITH TOMOSYNTHESIS AND CAD
TECHNIQUE: Bilateral screening digital craniocaudal and mediolateral oblique
mammograms were obtained. Bilateral screening digital breast
tomosynthesis was performed. The images were evaluated with
computer-aided detection.

[R CC synth-2D]
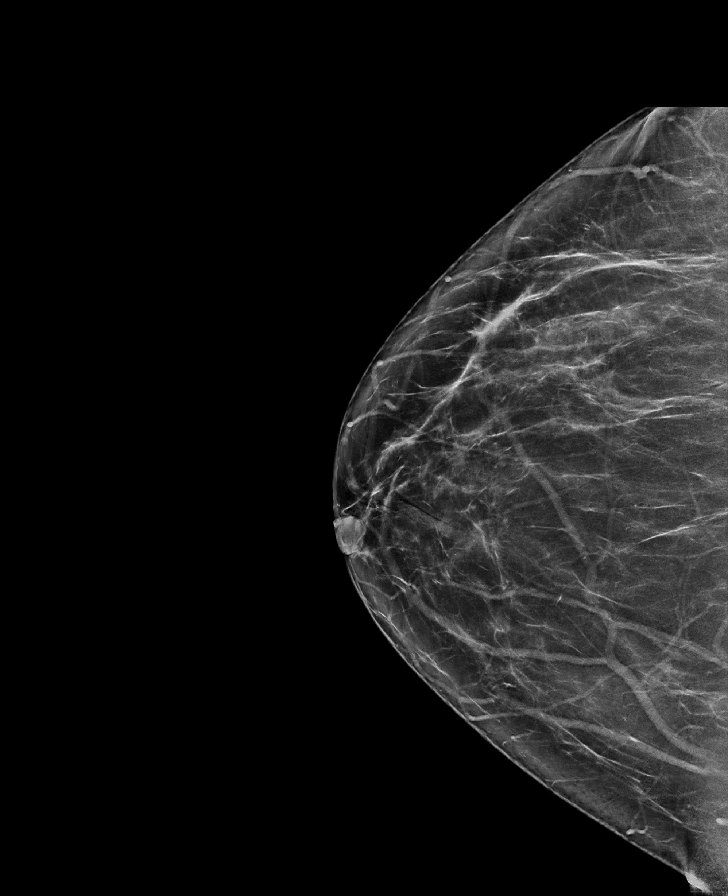

[R MLO synth-2D]
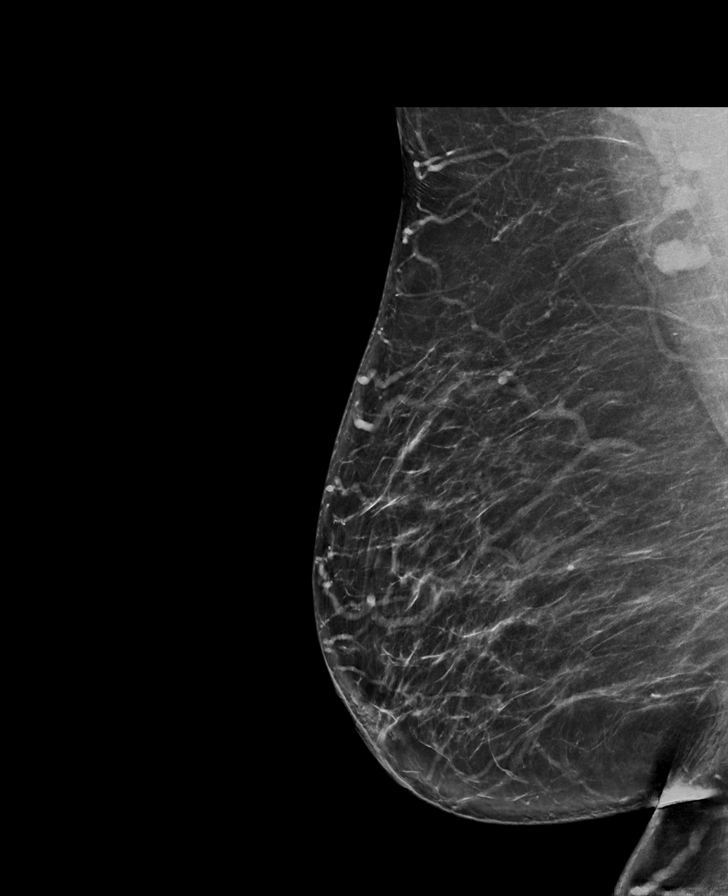

[L CC synth-2D]
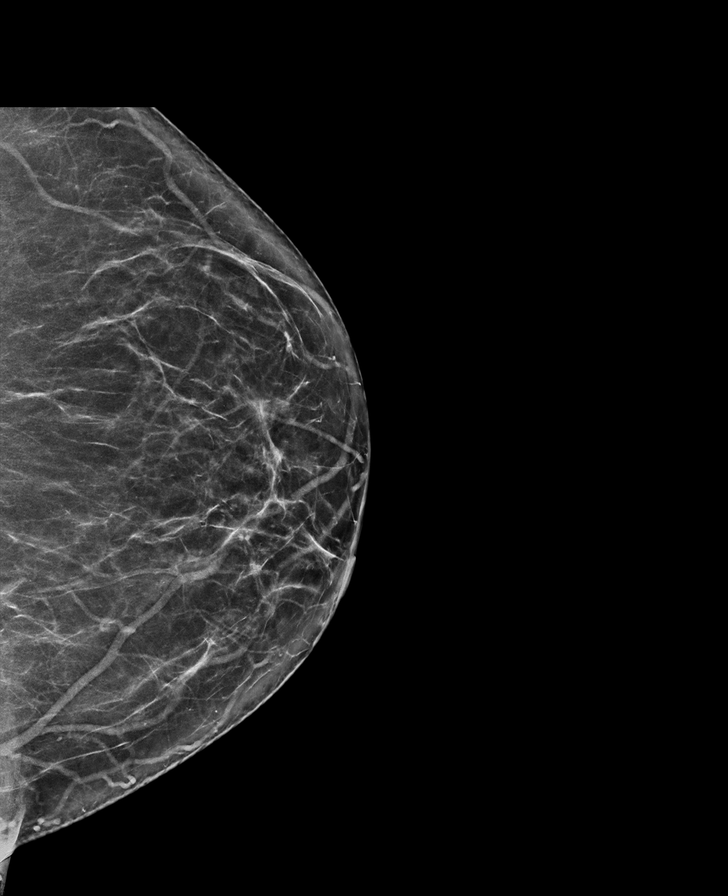

[L MLO synth-2D]
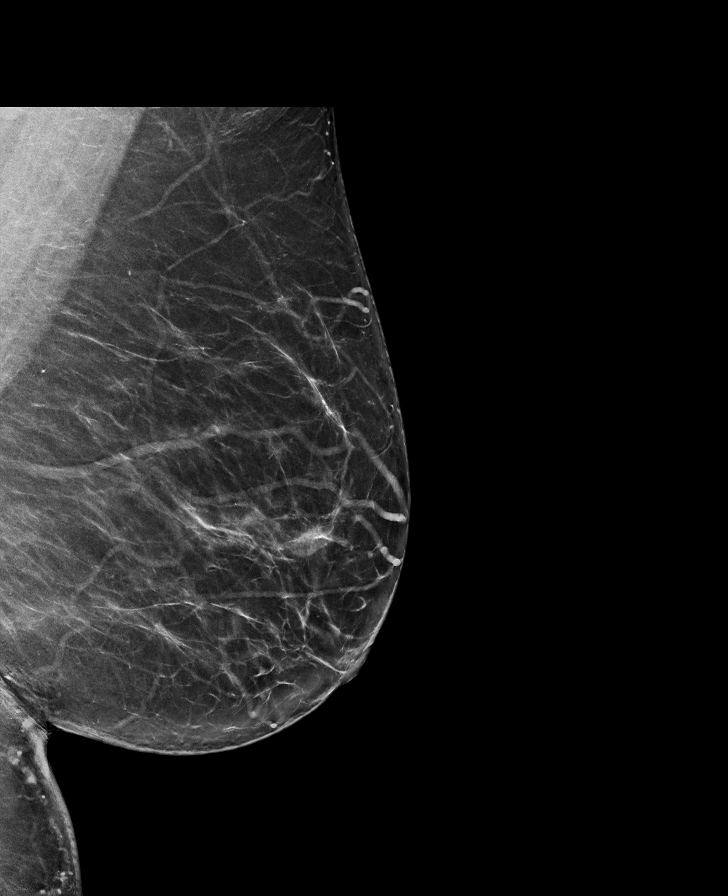

[R MLO tomo · tomo slice 43/85.0]
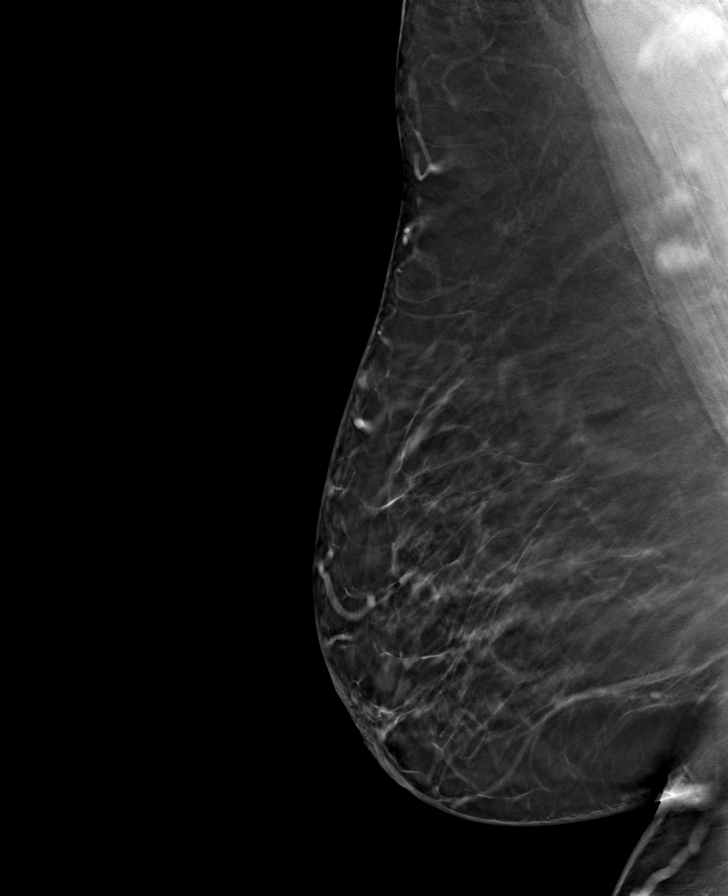

[L CC tomo · tomo slice 37/73.0]
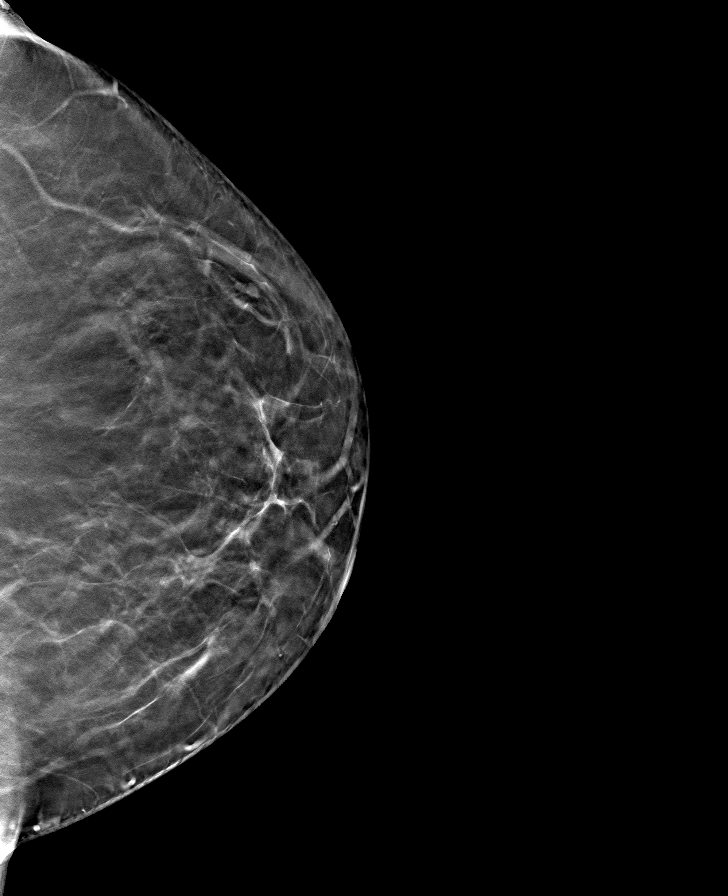

[L MLO tomo · tomo slice 42/83.0]
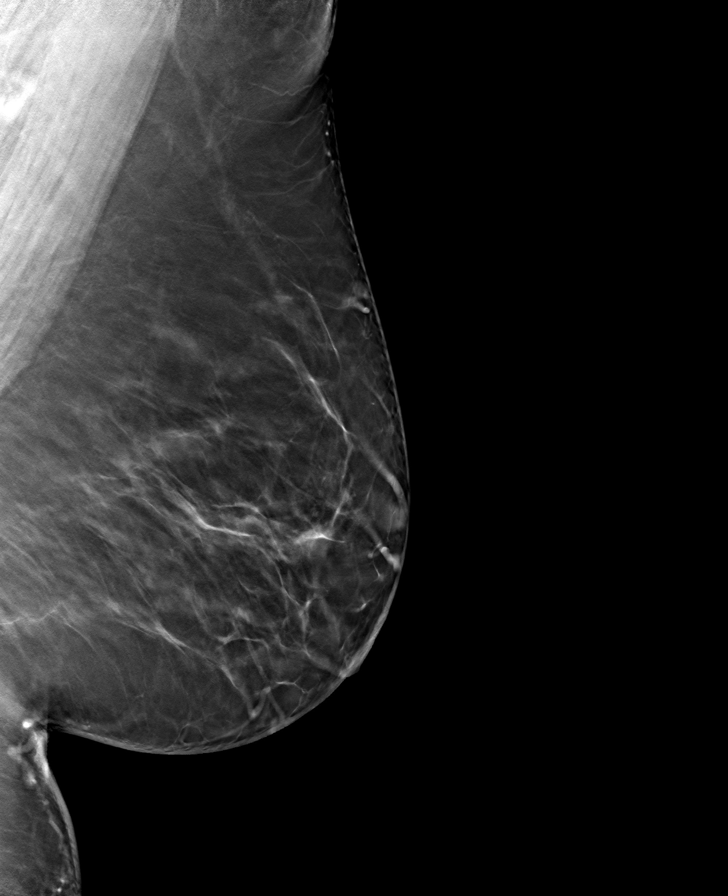

[R CC tomo · tomo slice 39/77.0]
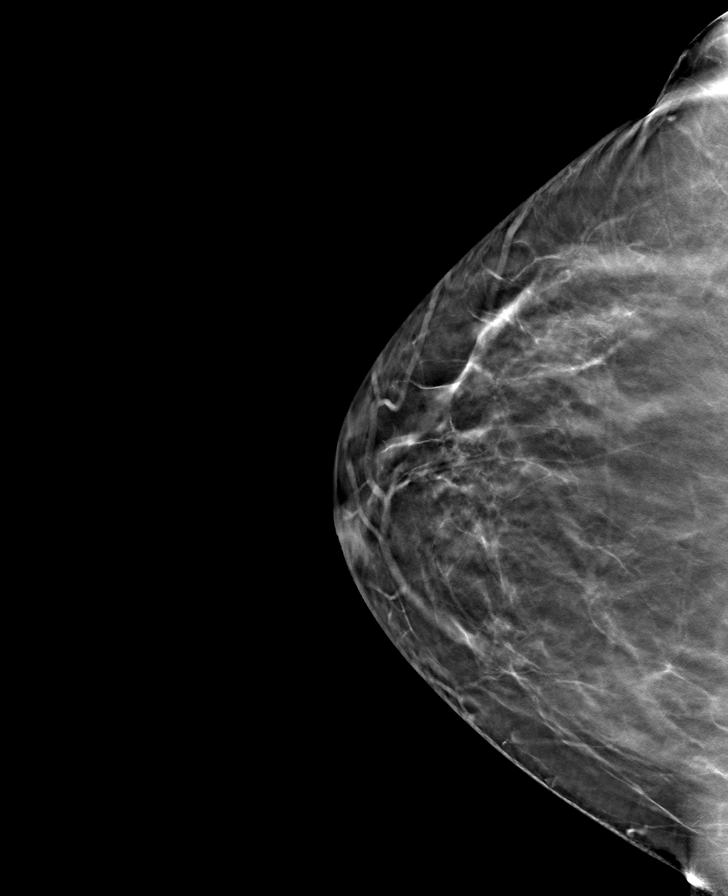

[8 of 24 positions shown; findings below may reference images not displayed]

ACR Breast Density Category b: There are scattered areas of
fibroglandular density.
FINDINGS: There are no findings suspicious for malignancy. The images were
evaluated with computer-aided detection.
IMPRESSION: No mammographic evidence of malignancy. A result letter of this
screening mammogram will be mailed directly to the patient.

RECOMMENDATION:
Screening mammogram in one year. (Code:[id number])

BI-RADS CATEGORY  1: Negative.

## 2022-03-26 IMAGING — CR DG KNEE COMPLETE 4+V*L*
4 series · 4 of 4 positions shown · non-contrast
Comparison: None.

CLINICAL DATA: Posterior knee pain.

EXAM:
LEFT KNEE - COMPLETE 4+ VIEW

[w knee ap left]
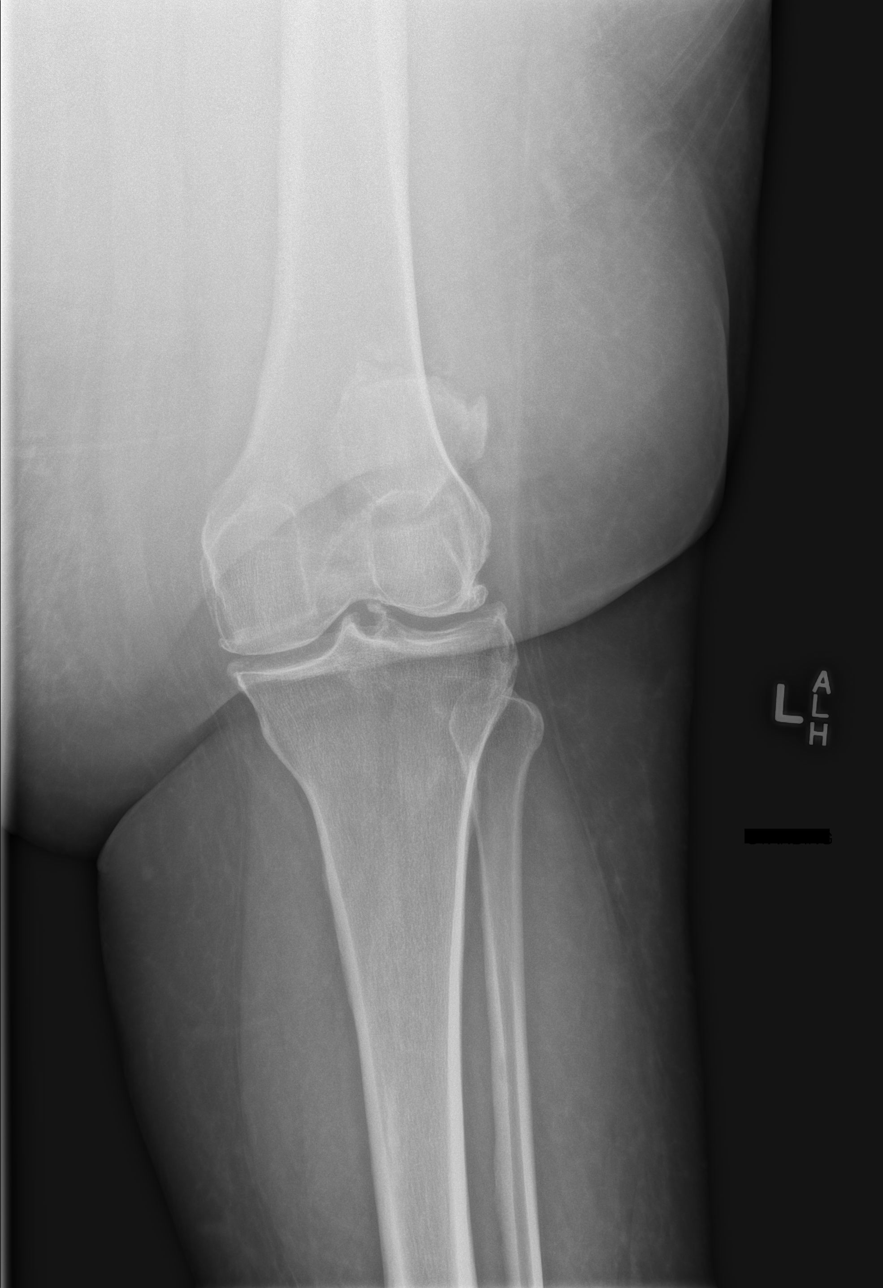

[w knee lat left]
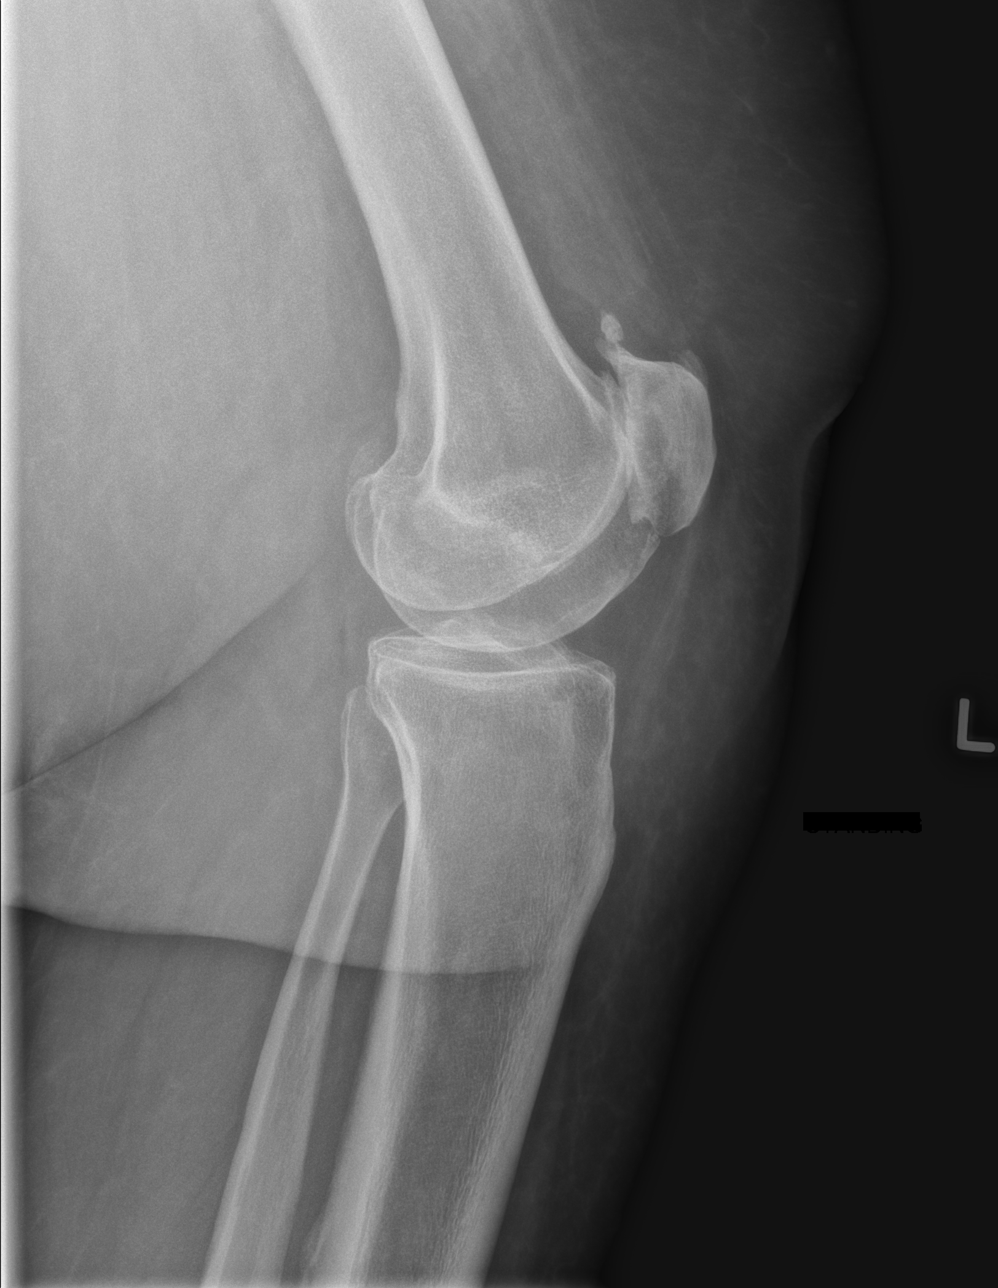

[w knee tunnel pa left]
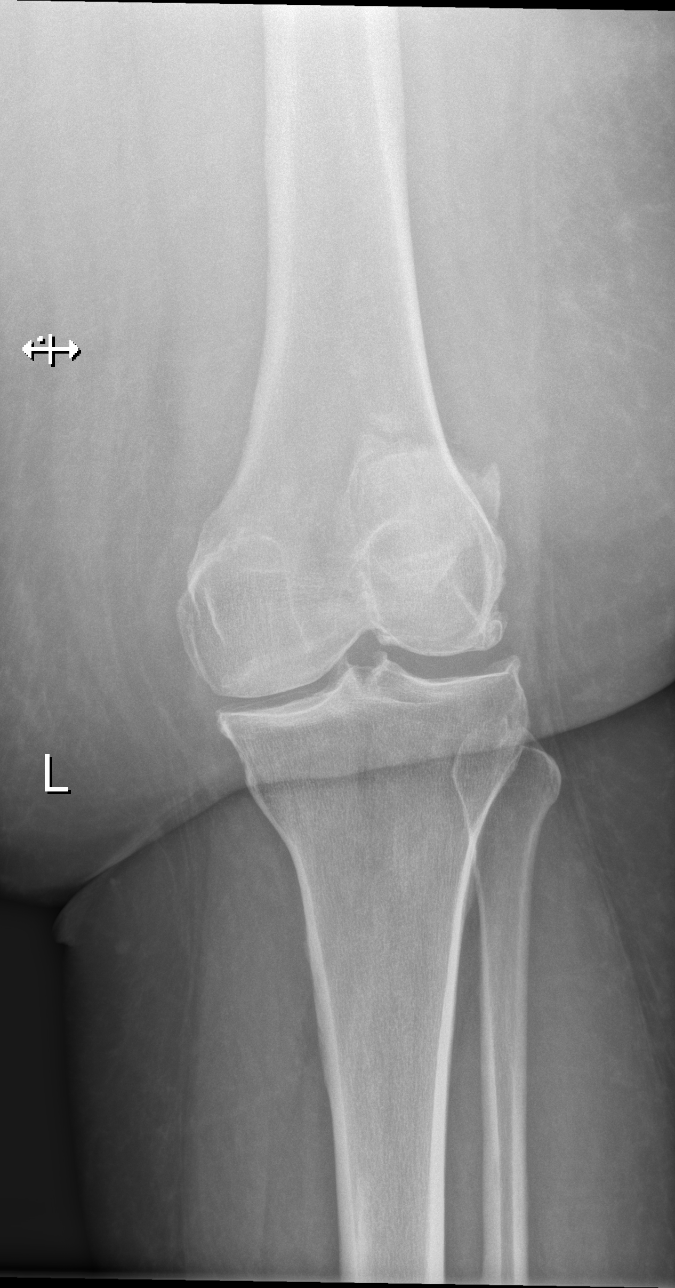

[x knee sunrise left]
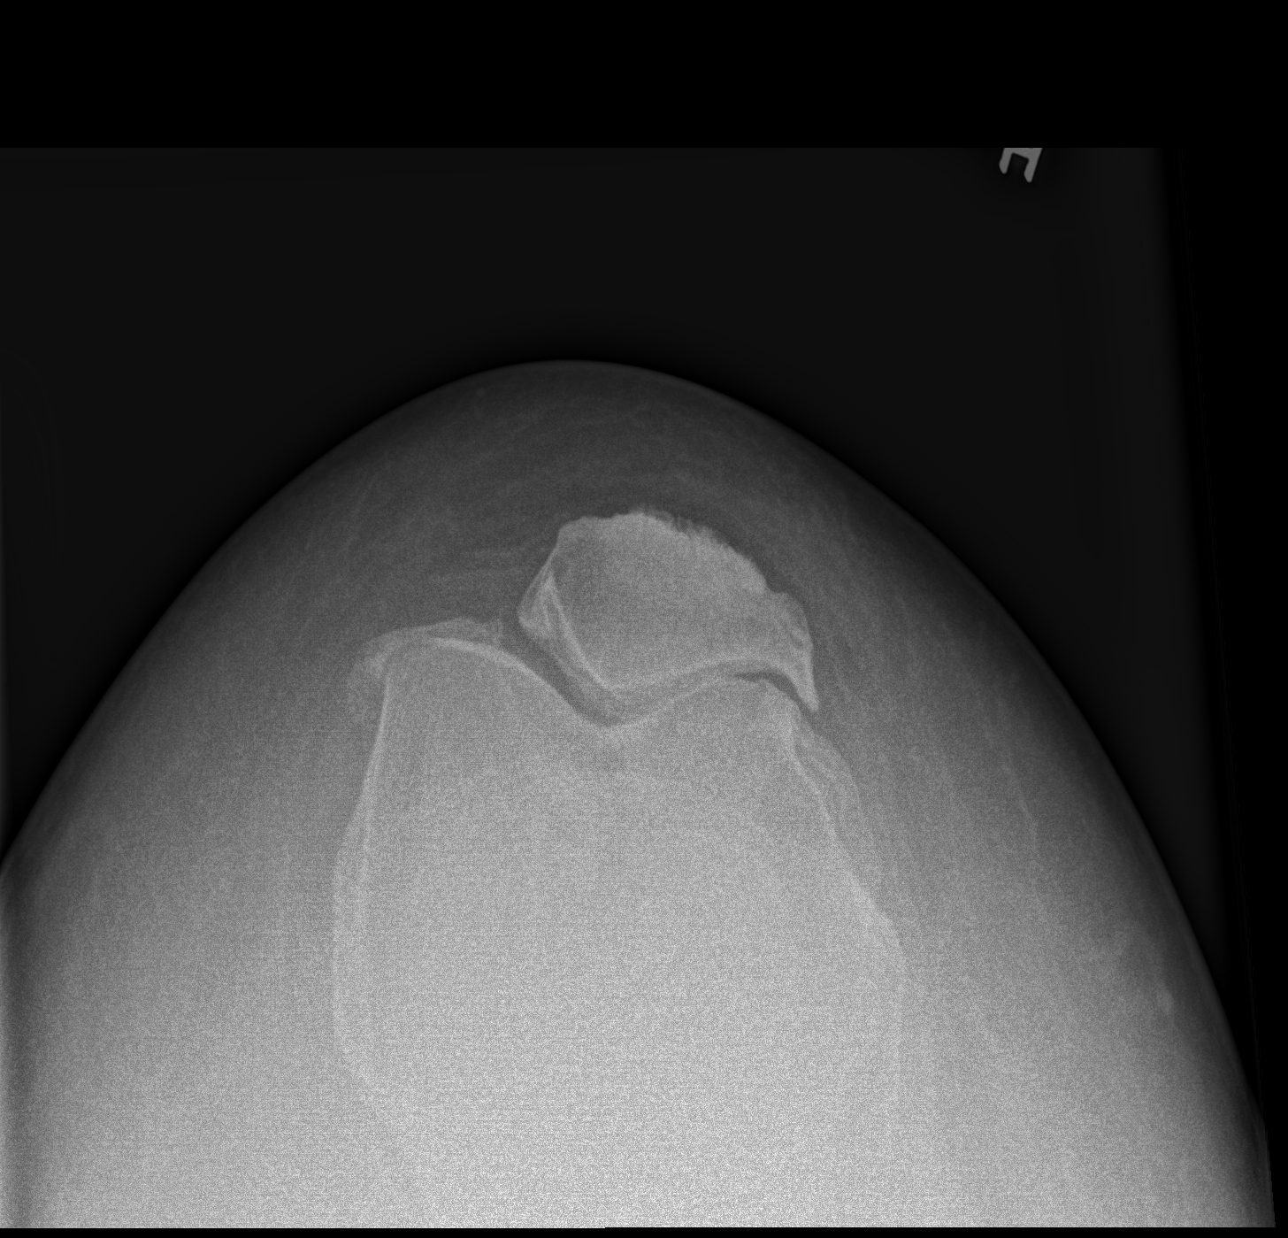

[4 of 4 positions shown; findings below may reference images not displayed]

FINDINGS: Tricompartmental degenerative changes are moderate. No joint
effusion or fracture. Enthesopathic changes associated with the
patella.
IMPRESSION: Tricompartmental degenerative changes. Patellar enthesopathic
changes.
